# Patient Record
Sex: Female | Born: 1940 | Race: White | Hispanic: Yes | Marital: Married | Smoking: Never smoker
Health system: Southern US, Community
[De-identification: ages and names within clinical notes are randomized; demographics above are authoritative.]

## PROBLEM LIST (undated history)

## (undated) DIAGNOSIS — E7800 Pure hypercholesterolemia, unspecified: Secondary | ICD-10-CM

## (undated) DIAGNOSIS — H01003 Unspecified blepharitis right eye, unspecified eyelid: Secondary | ICD-10-CM

## (undated) DIAGNOSIS — E119 Type 2 diabetes mellitus without complications: Secondary | ICD-10-CM

## (undated) DIAGNOSIS — H01006 Unspecified blepharitis left eye, unspecified eyelid: Secondary | ICD-10-CM

## (undated) DIAGNOSIS — K2100 Gastro-esophageal reflux disease with esophagitis, without bleeding: Secondary | ICD-10-CM

## (undated) DIAGNOSIS — F0280 Dementia in other diseases classified elsewhere without behavioral disturbance: Secondary | ICD-10-CM

## (undated) DIAGNOSIS — G709 Myoneural disorder, unspecified: Secondary | ICD-10-CM

## (undated) DIAGNOSIS — F419 Anxiety disorder, unspecified: Secondary | ICD-10-CM

## (undated) DIAGNOSIS — K760 Fatty (change of) liver, not elsewhere classified: Secondary | ICD-10-CM

## (undated) DIAGNOSIS — K5790 Diverticulosis of intestine, part unspecified, without perforation or abscess without bleeding: Secondary | ICD-10-CM

## (undated) DIAGNOSIS — R5383 Other fatigue: Secondary | ICD-10-CM

## (undated) DIAGNOSIS — E039 Hypothyroidism, unspecified: Secondary | ICD-10-CM

## (undated) DIAGNOSIS — I10 Essential (primary) hypertension: Secondary | ICD-10-CM

## (undated) DIAGNOSIS — M1990 Unspecified osteoarthritis, unspecified site: Secondary | ICD-10-CM

## (undated) HISTORY — DX: Pure hypercholesterolemia, unspecified: E78.00

## (undated) HISTORY — PX: CHOLECYSTECTOMY: SHX55

## (undated) HISTORY — DX: Hypothyroidism, unspecified: E03.9

## (undated) HISTORY — DX: Anxiety disorder, unspecified: F41.9

## (undated) HISTORY — DX: Gastro-esophageal reflux disease with esophagitis, without bleeding: K21.00

## (undated) HISTORY — DX: Unspecified osteoarthritis, unspecified site: M19.90

## (undated) HISTORY — PX: COLONOSCOPY: SHX174

## (undated) HISTORY — DX: Myoneural disorder, unspecified: G70.9

## (undated) HISTORY — DX: Essential (primary) hypertension: I10

## (undated) HISTORY — DX: Gastro-esophageal reflux disease with esophagitis: K21.0

## (undated) HISTORY — DX: Other fatigue: R53.83

## (undated) HISTORY — DX: Unspecified blepharitis right eye, unspecified eyelid: H01.003

## (undated) HISTORY — DX: Diverticulosis of intestine, part unspecified, without perforation or abscess without bleeding: K57.90

## (undated) HISTORY — DX: Dementia in other diseases classified elsewhere, unspecified severity, without behavioral disturbance, psychotic disturbance, mood disturbance, and anxiety: F02.80

## (undated) HISTORY — DX: Fatty (change of) liver, not elsewhere classified: K76.0

## (undated) HISTORY — DX: Unspecified blepharitis right eye, unspecified eyelid: H01.006

## (undated) HISTORY — PX: SHOULDER SURGERY: SHX246

---

## 2003-10-27 ENCOUNTER — Ambulatory Visit: Payer: Self-pay | Admitting: *Deleted

## 2003-10-27 ENCOUNTER — Ambulatory Visit: Payer: Self-pay | Admitting: Family Medicine

## 2003-11-20 ENCOUNTER — Ambulatory Visit: Payer: Self-pay | Admitting: Internal Medicine

## 2003-12-14 ENCOUNTER — Ambulatory Visit: Payer: Self-pay | Admitting: Internal Medicine

## 2004-04-28 ENCOUNTER — Ambulatory Visit: Payer: Self-pay | Admitting: Internal Medicine

## 2004-05-17 ENCOUNTER — Ambulatory Visit: Payer: Self-pay | Admitting: Internal Medicine

## 2004-09-28 ENCOUNTER — Ambulatory Visit: Payer: Self-pay | Admitting: Internal Medicine

## 2004-12-21 ENCOUNTER — Ambulatory Visit: Payer: Self-pay | Admitting: Internal Medicine

## 2004-12-22 ENCOUNTER — Ambulatory Visit: Payer: Self-pay | Admitting: Internal Medicine

## 2004-12-26 ENCOUNTER — Encounter: Admission: RE | Admit: 2004-12-26 | Discharge: 2005-01-05 | Payer: Self-pay | Admitting: Internal Medicine

## 2004-12-30 ENCOUNTER — Ambulatory Visit: Payer: Self-pay | Admitting: Internal Medicine

## 2005-01-06 ENCOUNTER — Ambulatory Visit: Payer: Self-pay | Admitting: Internal Medicine

## 2005-02-15 ENCOUNTER — Ambulatory Visit: Payer: Self-pay | Admitting: Internal Medicine

## 2005-02-23 ENCOUNTER — Encounter (INDEPENDENT_AMBULATORY_CARE_PROVIDER_SITE_OTHER): Payer: Self-pay | Admitting: Internal Medicine

## 2005-02-23 LAB — CONVERTED CEMR LAB: Hgb A1c MFr Bld: 6 %

## 2005-03-07 ENCOUNTER — Ambulatory Visit: Payer: Self-pay | Admitting: Internal Medicine

## 2005-11-29 ENCOUNTER — Ambulatory Visit: Payer: Self-pay | Admitting: Family Medicine

## 2005-12-21 ENCOUNTER — Encounter (INDEPENDENT_AMBULATORY_CARE_PROVIDER_SITE_OTHER): Payer: Self-pay | Admitting: Internal Medicine

## 2005-12-21 ENCOUNTER — Ambulatory Visit: Payer: Self-pay | Admitting: Family Medicine

## 2005-12-28 ENCOUNTER — Ambulatory Visit: Payer: Self-pay | Admitting: Family Medicine

## 2006-01-04 ENCOUNTER — Encounter: Payer: Self-pay | Admitting: Family Medicine

## 2006-01-04 ENCOUNTER — Encounter (INDEPENDENT_AMBULATORY_CARE_PROVIDER_SITE_OTHER): Payer: Self-pay | Admitting: Internal Medicine

## 2006-01-04 ENCOUNTER — Ambulatory Visit: Payer: Self-pay | Admitting: Family Medicine

## 2006-01-04 LAB — CONVERTED CEMR LAB: Pap Smear: NORMAL

## 2006-01-05 ENCOUNTER — Ambulatory Visit (HOSPITAL_COMMUNITY): Admission: RE | Admit: 2006-01-05 | Discharge: 2006-01-05 | Payer: Self-pay | Admitting: Family Medicine

## 2006-01-19 ENCOUNTER — Ambulatory Visit: Payer: Self-pay | Admitting: Internal Medicine

## 2006-02-23 ENCOUNTER — Ambulatory Visit: Payer: Self-pay | Admitting: Internal Medicine

## 2006-03-13 ENCOUNTER — Ambulatory Visit: Payer: Self-pay | Admitting: Internal Medicine

## 2006-04-06 ENCOUNTER — Ambulatory Visit: Payer: Self-pay | Admitting: Internal Medicine

## 2006-04-06 LAB — CONVERTED CEMR LAB
ALT: 54 units/L
LDL Cholesterol: 146 mg/dL
Triglycerides: 186 mg/dL

## 2006-04-07 ENCOUNTER — Encounter (INDEPENDENT_AMBULATORY_CARE_PROVIDER_SITE_OTHER): Payer: Self-pay | Admitting: Internal Medicine

## 2006-04-23 ENCOUNTER — Ambulatory Visit: Payer: Self-pay | Admitting: Internal Medicine

## 2006-04-26 ENCOUNTER — Ambulatory Visit: Payer: Self-pay | Admitting: Internal Medicine

## 2006-04-26 LAB — CONVERTED CEMR LAB: Blood Glucose, Fasting: 79 mg/dL

## 2006-09-01 ENCOUNTER — Encounter: Payer: Self-pay | Admitting: Internal Medicine

## 2006-09-01 ENCOUNTER — Encounter (INDEPENDENT_AMBULATORY_CARE_PROVIDER_SITE_OTHER): Payer: Self-pay | Admitting: Internal Medicine

## 2006-09-01 DIAGNOSIS — R519 Headache, unspecified: Secondary | ICD-10-CM | POA: Insufficient documentation

## 2006-09-01 DIAGNOSIS — R7309 Other abnormal glucose: Secondary | ICD-10-CM | POA: Insufficient documentation

## 2006-09-01 DIAGNOSIS — M67919 Unspecified disorder of synovium and tendon, unspecified shoulder: Secondary | ICD-10-CM | POA: Insufficient documentation

## 2006-09-01 DIAGNOSIS — R51 Headache: Secondary | ICD-10-CM

## 2006-09-01 DIAGNOSIS — E039 Hypothyroidism, unspecified: Secondary | ICD-10-CM | POA: Insufficient documentation

## 2006-09-01 DIAGNOSIS — R945 Abnormal results of liver function studies: Secondary | ICD-10-CM

## 2006-09-01 DIAGNOSIS — N952 Postmenopausal atrophic vaginitis: Secondary | ICD-10-CM | POA: Insufficient documentation

## 2006-09-01 DIAGNOSIS — F329 Major depressive disorder, single episode, unspecified: Secondary | ICD-10-CM

## 2006-09-01 DIAGNOSIS — E782 Mixed hyperlipidemia: Secondary | ICD-10-CM | POA: Insufficient documentation

## 2006-09-01 DIAGNOSIS — M719 Bursopathy, unspecified: Secondary | ICD-10-CM

## 2006-09-01 DIAGNOSIS — M799 Soft tissue disorder, unspecified: Secondary | ICD-10-CM | POA: Insufficient documentation

## 2006-09-01 DIAGNOSIS — K219 Gastro-esophageal reflux disease without esophagitis: Secondary | ICD-10-CM | POA: Insufficient documentation

## 2006-09-01 DIAGNOSIS — E669 Obesity, unspecified: Secondary | ICD-10-CM | POA: Insufficient documentation

## 2006-09-01 DIAGNOSIS — F3289 Other specified depressive episodes: Secondary | ICD-10-CM | POA: Insufficient documentation

## 2006-09-01 LAB — CONVERTED CEMR LAB: Hepatitis B Surface Ag: NEGATIVE

## 2006-09-04 ENCOUNTER — Encounter (INDEPENDENT_AMBULATORY_CARE_PROVIDER_SITE_OTHER): Payer: Self-pay | Admitting: Internal Medicine

## 2006-09-10 ENCOUNTER — Ambulatory Visit: Payer: Self-pay | Admitting: Internal Medicine

## 2006-10-30 ENCOUNTER — Ambulatory Visit: Payer: Self-pay | Admitting: Internal Medicine

## 2006-11-06 ENCOUNTER — Ambulatory Visit: Payer: Self-pay | Admitting: Internal Medicine

## 2006-11-07 ENCOUNTER — Encounter (INDEPENDENT_AMBULATORY_CARE_PROVIDER_SITE_OTHER): Payer: Self-pay | Admitting: *Deleted

## 2006-11-21 ENCOUNTER — Encounter: Admission: RE | Admit: 2006-11-21 | Discharge: 2006-11-21 | Payer: Self-pay | Admitting: Family Medicine

## 2006-12-11 ENCOUNTER — Ambulatory Visit: Payer: Self-pay | Admitting: Internal Medicine

## 2006-12-28 ENCOUNTER — Ambulatory Visit: Payer: Self-pay | Admitting: Internal Medicine

## 2007-01-30 ENCOUNTER — Ambulatory Visit: Payer: Self-pay | Admitting: Internal Medicine

## 2007-02-18 ENCOUNTER — Ambulatory Visit: Payer: Self-pay | Admitting: Internal Medicine

## 2007-02-18 LAB — CONVERTED CEMR LAB
BUN: 17 mg/dL (ref 6–23)
Potassium: 4.6 meq/L (ref 3.5–5.3)
Sodium: 140 meq/L (ref 135–145)
TSH: 5.685 microintl units/mL — ABNORMAL HIGH (ref 0.350–5.50)
Total Protein: 7.9 g/dL (ref 6.0–8.3)

## 2007-03-06 ENCOUNTER — Ambulatory Visit: Payer: Self-pay | Admitting: Internal Medicine

## 2007-03-06 LAB — CONVERTED CEMR LAB
ALT: 23 units/L (ref 0–35)
Alkaline Phosphatase: 54 units/L (ref 39–117)
CO2: 25 meq/L (ref 19–32)
Calcium: 9.6 mg/dL (ref 8.4–10.5)
Chloride: 103 meq/L (ref 96–112)
Glucose, Bld: 111 mg/dL — ABNORMAL HIGH (ref 70–99)
Potassium: 4.2 meq/L (ref 3.5–5.3)
Sodium: 139 meq/L (ref 135–145)
Total Protein: 7.4 g/dL (ref 6.0–8.3)

## 2007-03-26 ENCOUNTER — Ambulatory Visit (HOSPITAL_COMMUNITY): Admission: RE | Admit: 2007-03-26 | Discharge: 2007-03-26 | Payer: Self-pay | Admitting: Family Medicine

## 2007-04-02 ENCOUNTER — Ambulatory Visit: Payer: Self-pay | Admitting: Internal Medicine

## 2007-04-19 ENCOUNTER — Ambulatory Visit: Payer: Self-pay | Admitting: Internal Medicine

## 2007-04-22 ENCOUNTER — Ambulatory Visit (HOSPITAL_BASED_OUTPATIENT_CLINIC_OR_DEPARTMENT_OTHER): Admission: RE | Admit: 2007-04-22 | Discharge: 2007-04-22 | Payer: Self-pay | Admitting: Orthopedic Surgery

## 2007-04-29 ENCOUNTER — Ambulatory Visit: Payer: Self-pay | Admitting: Internal Medicine

## 2007-06-28 ENCOUNTER — Ambulatory Visit: Payer: Self-pay | Admitting: Internal Medicine

## 2007-07-17 ENCOUNTER — Encounter: Payer: Self-pay | Admitting: Family Medicine

## 2007-07-17 ENCOUNTER — Ambulatory Visit: Payer: Self-pay | Admitting: Internal Medicine

## 2007-07-17 LAB — CONVERTED CEMR LAB
Alkaline Phosphatase: 60 units/L (ref 39–117)
Basophils Relative: 1 % (ref 0–1)
Bilirubin, Direct: 0.1 mg/dL (ref 0.0–0.3)
Cholesterol: 213 mg/dL — ABNORMAL HIGH (ref 0–200)
Creatinine, Ser: 0.74 mg/dL (ref 0.40–1.20)
Eosinophils Absolute: 0.1 10*3/uL (ref 0.0–0.7)
LDL Cholesterol: 126 mg/dL — ABNORMAL HIGH (ref 0–99)
Lymphocytes Relative: 30 % (ref 12–46)
Lymphs Abs: 2.2 10*3/uL (ref 0.7–4.0)
MCHC: 30.9 g/dL (ref 30.0–36.0)
Monocytes Absolute: 0.7 10*3/uL (ref 0.1–1.0)
Neutrophils Relative %: 60 % (ref 43–77)
Platelets: 284 10*3/uL (ref 150–400)
TSH: 2.946 microintl units/mL (ref 0.350–5.50)
Total Bilirubin: 0.6 mg/dL (ref 0.3–1.2)
Triglycerides: 223 mg/dL — ABNORMAL HIGH (ref <150)

## 2007-07-24 ENCOUNTER — Ambulatory Visit: Payer: Self-pay | Admitting: Internal Medicine

## 2007-07-26 ENCOUNTER — Ambulatory Visit (HOSPITAL_COMMUNITY): Admission: RE | Admit: 2007-07-26 | Discharge: 2007-07-26 | Payer: Self-pay | Admitting: Family Medicine

## 2008-01-01 ENCOUNTER — Ambulatory Visit: Payer: Self-pay | Admitting: Family Medicine

## 2008-01-01 DIAGNOSIS — N814 Uterovaginal prolapse, unspecified: Secondary | ICD-10-CM | POA: Insufficient documentation

## 2008-01-03 ENCOUNTER — Ambulatory Visit (HOSPITAL_COMMUNITY): Admission: RE | Admit: 2008-01-03 | Discharge: 2008-01-03 | Payer: Self-pay | Admitting: Family Medicine

## 2008-03-05 ENCOUNTER — Ambulatory Visit: Payer: Self-pay | Admitting: Obstetrics and Gynecology

## 2008-08-10 ENCOUNTER — Ambulatory Visit: Payer: Self-pay | Admitting: Internal Medicine

## 2008-08-10 DIAGNOSIS — B029 Zoster without complications: Secondary | ICD-10-CM | POA: Insufficient documentation

## 2008-08-20 ENCOUNTER — Ambulatory Visit: Payer: Self-pay | Admitting: Internal Medicine

## 2008-09-04 ENCOUNTER — Ambulatory Visit: Payer: Self-pay | Admitting: Family Medicine

## 2008-09-04 DIAGNOSIS — B0229 Other postherpetic nervous system involvement: Secondary | ICD-10-CM

## 2008-11-11 ENCOUNTER — Ambulatory Visit: Payer: Self-pay | Admitting: Internal Medicine

## 2008-11-11 ENCOUNTER — Encounter: Payer: Self-pay | Admitting: Family Medicine

## 2008-11-11 LAB — CONVERTED CEMR LAB
BUN: 18 mg/dL (ref 6–23)
Basophils Relative: 0 % (ref 0–1)
CRP: 0.7 mg/dL — ABNORMAL HIGH (ref <0.6)
Calcium: 9.6 mg/dL (ref 8.4–10.5)
Chloride: 103 meq/L (ref 96–112)
Eosinophils Relative: 1 % (ref 0–5)
HCT: 40.7 % (ref 36.0–46.0)
Hemoglobin: 13.1 g/dL (ref 12.0–15.0)
MCV: 95.8 fL (ref 78.0–100.0)
Monocytes Absolute: 0.8 10*3/uL (ref 0.1–1.0)
Neutro Abs: 4.9 10*3/uL (ref 1.7–7.7)
Platelets: 310 10*3/uL (ref 150–400)
Potassium: 4.7 meq/L (ref 3.5–5.3)
RDW: 13.7 % (ref 11.5–15.5)
Rhuematoid fact SerPl-aCnc: <20 intl units/mL (ref 0–20)
Sodium: 140 meq/L (ref 135–145)
Total Bilirubin: 0.3 mg/dL (ref 0.3–1.2)
Total Protein: 7.8 g/dL (ref 6.0–8.3)

## 2008-11-23 ENCOUNTER — Ambulatory Visit: Payer: Self-pay | Admitting: Internal Medicine

## 2008-11-23 DIAGNOSIS — M1990 Unspecified osteoarthritis, unspecified site: Secondary | ICD-10-CM | POA: Insufficient documentation

## 2009-02-17 ENCOUNTER — Ambulatory Visit: Payer: Self-pay | Admitting: Nurse Practitioner

## 2009-02-17 DIAGNOSIS — I10 Essential (primary) hypertension: Secondary | ICD-10-CM

## 2009-02-17 DIAGNOSIS — M2550 Pain in unspecified joint: Secondary | ICD-10-CM

## 2009-02-17 LAB — CONVERTED CEMR LAB
Cholesterol, target level: 200 mg/dL
LDL Goal: 160 mg/dL

## 2009-02-18 ENCOUNTER — Ambulatory Visit: Payer: Self-pay | Admitting: Nurse Practitioner

## 2009-03-02 LAB — CONVERTED CEMR LAB
Anti Nuclear Antibody(ANA): NEGATIVE
Cholesterol: 218 mg/dL — ABNORMAL HIGH (ref 0–200)
TSH: 4.429 microintl units/mL (ref 0.350–4.500)

## 2009-03-08 ENCOUNTER — Ambulatory Visit: Payer: Self-pay | Admitting: Nurse Practitioner

## 2009-03-08 DIAGNOSIS — G4700 Insomnia, unspecified: Secondary | ICD-10-CM | POA: Insufficient documentation

## 2009-05-19 ENCOUNTER — Ambulatory Visit: Payer: Self-pay | Admitting: Nurse Practitioner

## 2009-05-19 DIAGNOSIS — M25569 Pain in unspecified knee: Secondary | ICD-10-CM | POA: Insufficient documentation

## 2009-06-07 ENCOUNTER — Ambulatory Visit: Payer: Self-pay | Admitting: Nurse Practitioner

## 2009-06-07 LAB — CONVERTED CEMR LAB
Cholesterol: 214 mg/dL — ABNORMAL HIGH (ref 0–200)
LDL Cholesterol: 132 mg/dL — ABNORMAL HIGH (ref 0–99)
Total CHOL/HDL Ratio: 4.4
VLDL: 33 mg/dL (ref 0–40)

## 2009-06-08 ENCOUNTER — Encounter (INDEPENDENT_AMBULATORY_CARE_PROVIDER_SITE_OTHER): Payer: Self-pay | Admitting: Nurse Practitioner

## 2009-08-17 ENCOUNTER — Encounter (INDEPENDENT_AMBULATORY_CARE_PROVIDER_SITE_OTHER): Payer: Self-pay | Admitting: Nurse Practitioner

## 2009-10-05 ENCOUNTER — Ambulatory Visit: Payer: Self-pay | Admitting: Internal Medicine

## 2009-10-05 DIAGNOSIS — R05 Cough: Secondary | ICD-10-CM

## 2009-10-06 ENCOUNTER — Encounter (INDEPENDENT_AMBULATORY_CARE_PROVIDER_SITE_OTHER): Payer: Self-pay | Admitting: Nurse Practitioner

## 2009-10-06 ENCOUNTER — Ambulatory Visit (HOSPITAL_COMMUNITY): Admission: RE | Admit: 2009-10-06 | Discharge: 2009-10-06 | Payer: Self-pay | Admitting: Internal Medicine

## 2009-10-20 ENCOUNTER — Telehealth (INDEPENDENT_AMBULATORY_CARE_PROVIDER_SITE_OTHER): Payer: Self-pay | Admitting: Nurse Practitioner

## 2009-11-04 ENCOUNTER — Ambulatory Visit: Payer: Self-pay | Admitting: Nurse Practitioner

## 2009-11-04 LAB — CONVERTED CEMR LAB
Basophils Absolute: 0 10*3/uL (ref 0.0–0.1)
Eosinophils Relative: 1 % (ref 0–5)
Lymphocytes Relative: 30 % (ref 12–46)
Lymphs Abs: 2.2 10*3/uL (ref 0.7–4.0)
MCV: 94.8 fL (ref 78.0–100.0)
Monocytes Absolute: 0.8 10*3/uL (ref 0.1–1.0)
RBC: 4.46 M/uL (ref 3.87–5.11)
RDW: 13.5 % (ref 11.5–15.5)

## 2009-11-05 ENCOUNTER — Encounter (INDEPENDENT_AMBULATORY_CARE_PROVIDER_SITE_OTHER): Payer: Self-pay | Admitting: Nurse Practitioner

## 2009-12-15 ENCOUNTER — Ambulatory Visit: Payer: Self-pay | Admitting: Nurse Practitioner

## 2010-02-20 HISTORY — PX: TOTAL ABDOMINAL HYSTERECTOMY: SHX209

## 2010-03-23 NOTE — Assessment & Plan Note (Signed)
Summary: F/u CXRAY/Cough   Vital Signs:  Patient profile:   70 year old female Menstrual status:  postmenopausal Weight:      167.4 pounds BMI:     29.53 O2 Sat:      98 % on Room air Temp:     97.6 degrees F oral Pulse rate:   72 / minute Pulse rhythm:   regular Resp:     16 per minute BP sitting:   138 / 90  (left arm) Cuff size:   regular  Vitals Entered By: Levon Hedger (November 04, 2009 9:45 AM)  Nutrition Counseling: Patient's BMI is greater than 25 and therefore counseled on weight management options.  O2 Flow:  Room air CC: follow-up bisit...pt is not taking any of her medication                                                           , Hypertension Management, Abdominal Pain Is Patient Diabetic? No CBG Result 117 CBG Device ID B  Does patient need assistance? Functional Status Self care Ambulation Normal Comments pt not taking celebrex anymore.   CC:  follow-up bisit...pt is not taking any of her medication                                                           , Hypertension Management, and Abdominal Pain.  History of Present Illness:  Pt into the office for f/u for visit on cough. Pt was prescribed lisinopril in [place] and she now reports that she only took for 1 week. However, the pharamacy kept giving her the medications despite the fact that she was not taking it. Pt stopped taking the medications because her BP checks at the pharmacy were good.  CXRAY done on 10/06/2009 - Wedge shaped peripherally opacity in the lateral left lung base.  suspicious for an area of infection. Pt was not treated with antibiotics.   Denies any worsening of symptoms and actually improved. She was ordered xyzal and tessalon perles which she has not taken  because cough improves.  Community liason into the room to interpret for pt  Dyspepsia History:      She has no alarm features of dyspepsia including no history of melena, hematochezia, dysphagia, persistent  vomiting, or involuntary weight loss > 5%.  There is a prior history of GERD.  The patient does not have a prior history of documented ulcer disease.  The dominant symptom is heartburn or acid reflux.  An H-2 blocker medication is currently being taken.  She notes that the symptoms have not improved with the H-2 blocker therapy.  Symptoms have not persisted after 4 weeks of H-2 blocker treatment.  No previous upper endoscopy has been done.    Hypertension History:      She denies headache, chest pain, and palpitations.  Pt was advised to hold her lisinopril following last visit due to cough.  .  Further comments include: Pt has also stopped celebrex and reports that she believes that was the cause for cough.        Positive major cardiovascular risk factors include  female age 52 years old or older, hyperlipidemia, and hypertension.  Negative major cardiovascular risk factors include no history of diabetes and non-tobacco-user status.        Further assessment for target organ damage reveals no history of ASHD, cardiac end-organ damage (CHF/LVH), stroke/TIA, peripheral vascular disease, renal insufficiency, or hypertensive retinopathy.     Habits & Providers  Alcohol-Tobacco-Diet     Alcohol drinks/day: 0     Tobacco Status: never  Exercise-Depression-Behavior     Does Patient Exercise: no     Exercise Counseling: to improve exercise regimen     Type of exercise: walk     Depression Counseling: not indicated; screening negative for depression  Allergies (verified): No Known Drug Allergies  Review of Systems General:  Denies fever. CV:  Denies chest pain or discomfort. Resp:  Denies cough; cough has improved. GI:  Complains of indigestion. MS:  celebrex worked well for her joints but she stopped because she thought it was causing a cough.  Physical Exam  General:  alert.   Head:  normocephalic.   Lungs:  normal breath sounds.   Heart:  normal rate and regular rhythm.   Abdomen:   normal bowel sounds.   Msk:  up to the exam table Neurologic:  alert & oriented X3.   Skin:  color normal.   Psych:  Oriented X3.     Impression & Recommendations:  Problem # 1:  COUGH (ICD-786.2)  improved likely that celebrex over time was given pt reflux symptoms which was causing her to cough  Orders: T-CBC w/Diff (14782-95621) Pulse Oximetry (single measurment) (30865)  Problem # 2:  GASTROESOPHAGEAL REFLUX DISEASE (ICD-530.81) will give ranitidine for 1 month as she can get from [facility] Will then try to get aciphex from [place] pharmacy Her updated medication list for this problem includes:    Aciphex 20 Mg Tbec (Rabeprazole sodium) ..... One tablet by mouth before breakfast for stomach    Ranitidine Hcl 300 Mg Tabs (Ranitidine hcl) ..... One tablet by mouth nightly for stomach before bedtime  Orders: Hemoglobin A1C (83036)  Problem # 3:  OSTEOARTHRITIS (ICD-715.90) celebrex works well but is likely causing GI problems  Her updated medication list for this problem includes:    Celebrex 200 Mg Caps (Celecoxib) ..... One capsule by mouth daily for joints    Tramadol Hcl 50 Mg Tabs (Tramadol hcl) ..... One tablet by mouth two times a day as needed for pain  Complete Medication List: 1)  Celebrex 200 Mg Caps (Celecoxib) .... One capsule by mouth daily for joints 2)  Tramadol Hcl 50 Mg Tabs (Tramadol hcl) .... One tablet by mouth two times a day as needed for pain 3)  Aciphex 20 Mg Tbec (Rabeprazole sodium) .... One tablet by mouth before breakfast for stomach 4)  Nasacort Aq 55 Mcg/act Aers (Triamcinolone acetonide(nasal)) .... 2 sprays each nostril daily 5)  Ranitidine Hcl 300 Mg Tabs (Ranitidine hcl) .... One tablet by mouth nightly for stomach before bedtime  Other Orders: Capillary Blood Glucose/CBG (78469)  Dyspepsia Assessment/Plan:  Step Therapy: GERD Treatment Protocols:    Step-1: started    H-2 blocker chosen: Ranitidine 150mg  by mouth at  bedtime  Hypertension Assessment/Plan:      The patient's hypertensive risk group is category B: At least one risk factor (excluding diabetes) with no target organ damage.  Her calculated 10 year risk of coronary heart disease is 15 %.  Today's blood pressure is 138/90.  Her blood pressure goal is <  140/90.   Patient Instructions: 1)  Start ranitidine 300mg  by mouth nightly. 2)  Take this for one week then start back taking the celebrex 200mg  by mouth daily. Take the celebrex AFTER food 3)  Follow up in 4 weeks with n.martin,fnp for acid reflux 4)  May get flu vaccine if available at that time Prescriptions: RANITIDINE HCL 300 MG TABS (RANITIDINE HCL) One tablet by mouth nightly for stomach before bedtime  #30 x 0   Entered and Authorized by:   [name] FNP   Signed by:   [name] FNP on 11/04/2009   Method used:   Print then Give to Patient   RxID:   [id number] ACIPHEX 20 MG TBEC (RABEPRAZOLE SODIUM) One tablet by mouth before breakfast for stomach  #30 x 5   Entered and Authorized by:   [name] FNP   Signed by:   [name] FNP on 11/04/2009   Method used:   Faxed to ...       [facility] (retail)       [address] [place], [place]  [address]       Ph: [phone]       Fax: [phone]   RxID:   [id number]

## 2010-03-23 NOTE — Letter (Signed)
Summary: Handout Printed  Printed Handout:  - Insomnia 

## 2010-03-23 NOTE — Miscellaneous (Signed)
Summary: FLU SHOT  FLU SHOT   Imported By: Arta Bruce 12/16/2009 15:26:52  _____________________________________________________________________  External Attachment:    Type:   Image     Comment:   External Document

## 2010-03-23 NOTE — Letter (Signed)
Summary: *HSN Results Follow up  [facility]  [address] [place], [place] [address]   Phone: [phone]  Fax: [phone]      11/05/2009   Heidi Preston [address] [place], [place]  [address]   Dear  [name],                            ____S.Drinkard,FNP   ____D. Gore,FNP       ____B. McPherson,MD   ____V. Rankins,MD    ____E. Mulberry,MD    __X__N. Daphine Deutscher, FNP  ____D. Reche Dixon, MD    ____K. Philipp Deputy, MD    ____Other     This letter is to inform you that your recent test(s):  _______Pap Smear    ___X____Lab Test     _______X-ray    ___X____ is within acceptable limits  _______ requires a medication change  _______ requires a follow-up lab visit  _______ requires a follow-up visit with your provider   Comments:  Labs done during recent office visit are normal.       _________________________________________________________ If you have any questions, please contact our office [phone].                    Sincerely,    [name] FNP [facility]

## 2010-03-23 NOTE — Letter (Signed)
Summary: Handout Printed  Printed Handout:  - Diet - Low-Cholesterol Guidelines 

## 2010-03-23 NOTE — Letter (Signed)
Summary: Lipid Letter  [facility]  [address] [place], [place] [address]   Phone: [phone]  Fax: [phone]    06/08/2009  [facility] [address] [place], [place]  [address]  Dear Ms. Gosnell-zapata:  We have carefully reviewed your last lipid profile from 06/07/2009 and the results are noted below with a summary of recommendations for lipid management.    Cholesterol:       214     Goal: less than 200   HDL "good" Cholesterol:   49     Goal: greater than 40   LDL "bad" Cholesterol:   132     Goal: less than 130   Triglycerides:       164     Goal: les than 150    Cholesterol is still slightly elevated.  No medications at this time but AVOID fried fatty foods. Eat low fat foods.  Remember to increase your good cholesterol by walking and physical activity.  Thyroid labs ok.      Current Medications: 1)    Celebrex 200 Mg Caps (Celecoxib) .... One capsule by mouth daily for joints 2)    Tramadol Hcl 50 Mg Tabs (Tramadol hcl) .... One tablet by mouth two times a day as needed for pain 3)    Aciphex 20 Mg Tbec (Rabeprazole sodium) .... One tablet by mouth daily before breakfast 4)    Lisinopril 10 Mg Tabs (Lisinopril) .... One tablet by mouth daily for blood pressure  If you have any questions, please call. We appreciate being able to work with you.   Sincerely,    [facility] [name] FNP

## 2010-03-23 NOTE — Letter (Signed)
Summary: CONNECTION TO CARE  CONNECTION TO CARE   Imported By: Arta Bruce 08/18/2009 12:41:48  _____________________________________________________________________  External Attachment:    Type:   Image     Comment:   External Document

## 2010-03-23 NOTE — Assessment & Plan Note (Signed)
Summary: Heidi Deutscher FNP/ BAD COUGH/ STOMACH PAIN//G   Vital Signs:  Patient profile:   70 year old female Menstrual status:  postmenopausal Weight:      169 pounds Temp:     97.7 degrees F Pulse rate:   64 / minute Pulse rhythm:   regular Resp:     18 per minute BP sitting:   120 / 80  (left arm) Cuff size:   regular  Vitals Entered By: Vesta Mixer CMA (October 05, 2009 10:27 AM) CC: throat feeling dry for about one month, almost feeling like she has to throw up Is Patient Diabetic? No  Does patient need assistance? Ambulation Normal   CC:  throat feeling dry for about one month and almost feeling like she has to throw up.  History of Present Illness: 1.  Severe cough:  Difficulty pinning pt. down on this, but sounds like it happens at any time.  Generally a dry cough.  Has had cough for abou 2 months.  Was on Lisinopril, but states has not taken for 1 month.  Pt. states cough started off mild and gradually worsened.   Throat is dry.  Coughs in parosxyms to point of eyes watering, cannot breathe and like she will vomit.  No congestion, posterior pharyngeal drainage. Coughing often preceded by tickle in throat.  Feels fine when not coughing--no dyspnea then.    Pt. is a nonsmoker and is not around any smokers.  2.  Hypertension:  pt. stopped Lisinopril as she was checking her blood pressure in pharmacies and it was okay, so stopped.   Husband brings in bottle filled 09/24/09--all 30 pills there.  Pt. really firm that she stopped the medicine at least 1 month ago.  Allergies (verified): No Known Drug Allergies  Physical Exam  General:  Overweight female, NAD Eyes:  No corneal or conjunctival inflammation noted. EOMI. Perrla. Funduscopic exam benign, without hemorrhages, exudates or papilledema. Vision grossly normal. Ears:  External ear exam shows no significant lesions or deformities.  Otoscopic examination reveals clear canals, tympanic membranes are intact bilaterally without  bulging, retraction, inflammation or discharge. Hearing is grossly normal bilaterally. Nose:  Possibly some mild mucosal swelling.  No discharge Mouth:  pharynx pink and moist.  No obvious cobbling. Neck:  No deformities, masses, or tenderness noted. Lungs:  Normal respiratory effort, chest expands symmetrically. Lungs are clear to auscultation, no crackles or wheezes. Heart:  Normal rate and regular rhythm. S1 and S2 normal without gallop, murmur, click, rub or other extra sounds.  Radial pulses normal and equal.   Impression & Recommendations:  Problem # 1:  COUGH (ICD-786.2)  Suspect this started out secondary to ACE I side effect. Treat with cough suppressants and allergy meds with the sense of tickling in throat. Hold on ACE I and consider ARB when returns for follow up if BP back up.  Orders: Diagnostic X-Ray/Fluoroscopy (Diagnostic X-Ray/Flu)  Problem # 2:  HYPERTENSION, BENIGN ESSENTIAL (ICD-401.1) Discussed with pt. that she does have the diagnosis of hypertension and that her blood pressures were probably better as she was on the medication. Discussed her bp would likely elevate without the Lisinopril. The following medications were removed from the medication list:    Lisinopril 10 Mg Tabs (Lisinopril) ..... One tablet by mouth daily for blood pressure  Complete Medication List: 1)  Celebrex 200 Mg Caps (Celecoxib) .... One capsule by mouth daily for joints 2)  Tramadol Hcl 50 Mg Tabs (Tramadol hcl) .... One tablet by mouth two  times a day as needed for pain 3)  Aciphex 20 Mg Tbec (Rabeprazole sodium) .... One tablet by mouth daily before breakfast 4)  Nasacort Aq 55 Mcg/act Aers (Triamcinolone acetonide(nasal)) .... 2 sprays each nostril daily 5)  Xyzal 5 Mg Tabs (Levocetirizine dihydrochloride) .Marland Kitchen.. 1 tab by mouth daily 6)  Tessalon Perles 100 Mg Caps (Benzonatate) .Marland Kitchen.. 1 cap by mouth every 8 hours as needed for cough--do not cough or bite  Patient Instructions: 1)   Follow up with Jesse Fall FNP in 1 month for cough and hypertension. 2)  Stop Lisinopril to avoid worsening. Prescriptions: TESSALON PERLES 100 MG CAPS (BENZONATATE) 1 cap by mouth every 8 hours as needed for cough--do not cough or bite  #20 x 0   Entered and Authorized by:   Julieanne Manson MD   Signed by:   Julieanne Manson MD on 10/05/2009   Method used:   Faxed to ...       [facility] (retail)       [address] [place], [place]  [address]       Ph: [phone]       Fax: [phone]   RxID:   [id number] XYZAL 5 MG TABS (LEVOCETIRIZINE DIHYDROCHLORIDE) 1 tab by mouth daily  #30 x 4   Entered and Authorized by:   Julieanne Manson MD   Signed by:   Julieanne Manson MD on 10/05/2009   Method used:   Faxed to ...       [facility] - [facility] (retail)       [address] [place], [place]  [address]       Ph: [phone]       Fax: [phone]   RxID:   [id number] NASACORT AQ 55 MCG/ACT AERS (TRIAMCINOLONE ACETONIDE(NASAL)) 2 sprays each nostril daily  #1 x 4   Entered and Authorized by:   Julieanne Manson MD   Signed by:   Julieanne Manson MD on 10/05/2009   Method used:   Faxed to ...       [facility] - [facility] (retail)       [address] [place], [place]  [address]       Ph: [phone]       Fax: [phone]   RxID:   [id number]   Appended Document: Heidi Deutscher FNP/ BAD COUGH/ STOMACH PAIN//G Medications Added LISINOPRIL 20 MG TABS (LISINOPRIL) stop lisinopril          Clinical Lists Changes  Medications: Added new medication of LISINOPRIL 20 MG TABS (LISINOPRIL) stop lisinopril - Signed Removed medication of LISINOPRIL 20 MG TABS (LISINOPRIL) stop lisinopril Rx of LISINOPRIL 20 MG TABS (LISINOPRIL) stop lisinopril;  #0 x 0;  Signed;  Entered by: Julieanne Manson MD;  Authorized by: Julieanne Manson MD;  Method  used: Faxed to [facility], [address], [place], [place]  [address], Ph: [phone], Fax: [phone]    Prescriptions: LISINOPRIL 20 MG TABS (LISINOPRIL) stop lisinopril  #0 x 0   Entered and Authorized by:   Julieanne Manson MD   Signed by:   Julieanne Manson MD on 10/05/2009   Method used:   Faxed to ...       [facility] - [facility] (retail)       [address] [place],  [place]  [address]       Ph: [phone]       Fax: [phone]   RxID:   [id number]  Used to send fax to [facility]-- to stop Lisinopril--not represcribing

## 2010-03-23 NOTE — Progress Notes (Signed)
Summary: Darlin Coco results   Phone Note Outgoing Call   Summary of Call: Spanish speaking pt call pt and see how she is doing.  She needs to come in for follow up. CXRAY done on 10/06/2009 shows that she may have had an infection (faxed results in my office - unsure why it was not sent electronically) Initial call taken by: [name] FNP,  October 20, 2009 5:16 PM  Follow-up for Phone Call        Pt is feeling Good since she stop taking Lisinopril when she came to see Dr Delrae Alfred F/u appt 11-04-09 @ 9:45am  Follow-up by: Cheryll Dessert,  October 21, 2009 8:36 AM      CXR  Procedure date:  10/06/2009  Findings:      Wedge shaped peripherally opacity in the lateral left lung base.  suspicious for an area of infection. recommended radiographic f/u until clearing.  Phone Note Outgoing Call   Summary of Call: Spanish speaking pt call pt and see how she is doing.  She needs to come in for follow up. CXRAY done on 10/06/2009 shows that she may have had an infection (faxed results in my office - unsure why it was not sent electronically) Initial call taken by: [name] FNP,  October 20, 2009 5:16 PM  Follow-up for Phone Call        Pt is feeling Good since she stop taking Lisinopril when she came to see Dr Delrae Alfred F/u appt 11-04-09 @ 9:45am  Follow-up by: Cheryll Dessert,  October 21, 2009 8:36 AM      CXR  Procedure date:  10/06/2009  Findings:      Wedge shaped peripherally opacity in the lateral left lung base.  suspicious for an area of infection. recommended radiographic f/u until clearing.

## 2010-03-23 NOTE — Assessment & Plan Note (Signed)
Summary: HTN/Review lab results   Vital Signs:  Patient profile:   70 year old female Menstrual status:  postmenopausal Height:      63.25 inches Weight:      165.5 pounds BMI:     29.19 Temp:     97.8 degrees F oral Pulse rate:   67 / minute Pulse rhythm:   regular Resp:     18 per minute BP sitting:   145 / 83  (left arm) Cuff size:   regular  Vitals Entered By: Arthor Captain (March 08, 2009 11:04 AM) CC: follow-up visit, jt. pain, Hypertension Management, Lipid Management Pain Assessment Patient in pain? yes     Location: generalized joint pain Intensity: 2 Type: aching Onset of pain  Chronic  Does patient need assistance? Functional Status Self care Ambulation Normal     Menstrual Status postmenopausal Last PAP Result Normal   CC:  follow-up visit, jt. pain, Hypertension Management, and Lipid Management.  History of Present Illness:  Pt into the office for follow on joint pain Pt has started the celebrex as ordered. She is taking the tramadol as needed at night. Still with pain in her hands and shoulders. 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    Hypertension History:      She denies headache, chest pain, and palpitations.  no current medications.        Positive major cardiovascular risk factors include female age 54 years old or older, hyperlipidemia, and hypertension.  Negative major cardiovascular risk factors include no history of diabetes and non-tobacco-user status.        Further assessment for target organ damage reveals no history of ASHD, cardiac end-organ damage (CHF/LVH), stroke/TIA, peripheral vascular disease, renal insufficiency, or hypertensive retinopathy.    Lipid Management History:      Positive NCEP/ATP III risk factors include female age 68 years old or older and hypertension.  Negative NCEP/ATP III risk factors include non-diabetic, non-tobacco-user status, no ASHD (atherosclerotic heart disease), no prior stroke/TIA, no  peripheral vascular disease, and no history of aortic aneurysm.        The patient states that she does not know about the "Therapeutic Lifestyle Change" diet.  The patient does not know about adjunctive measures for cholesterol lowering.  She expresses no side effects from her lipid-lowering medication.  Comments include: no current meds at this time.  The patient denies any symptoms to suggest myopathy or liver disease.      Allergies (verified): No Known Drug Allergies  Review of Systems General:  Complains of sleep disorder; lies in bed for 2 hours before going to sleep. going on for the past 6 months. . CV:  Denies chest pain or discomfort. Resp:  Denies cough. GI:  Denies abdominal pain, nausea, and vomiting. MS:  Complains of joint pain; hands and shoulders.  Physical Exam  General:  alert.   Eyes:  glasses Lungs:  normal breath sounds.   Heart:  normal rate and regular rhythm.   holosystolic murmur Msk:  active ROM up to the exam table   Impression & Recommendations:  Problem # 1:  OSTEOARTHRITIS (ICD-715.90) reviewed labs with pt advised that she continue the celebrex Her updated medication list for this problem includes:    Celebrex 200 Mg Caps (Celecoxib) ..... One capsule by mouth daily for joints    Tramadol Hcl 50 Mg Tabs (Tramadol hcl) ..... One tablet by mouth two times a day as needed for pain  Problem #  2:  HYPERTENSION, BENIGN ESSENTIAL (ICD-401.1)  will need to start medications because BP is elevated again today DASH diet  Her updated medication list for this problem includes:    Lisinopril 10 Mg Tabs (Lisinopril) ..... One tablet by mouth daily for blood pressure  Problem # 3:  HYPERLIPIDEMIA, MIXED (ICD-272.2) reviewed labs with pt  Problem # 4:  INSOMNIA (ICD-780.52) advsied pt to read handout about sleep habits  Complete Medication List: 1)  Celebrex 200 Mg Caps (Celecoxib) .... One capsule by mouth daily for joints 2)  Tramadol Hcl 50 Mg  Tabs (Tramadol hcl) .... One tablet by mouth two times a day as needed for pain 3)  Aciphex 20 Mg Tbec (Rabeprazole sodium) .... One tablet by mouth daily before breakfast 4)  Lisinopril 10 Mg Tabs (Lisinopril) .... One tablet by mouth daily for blood pressure  Hypertension Assessment/Plan:      The patient's hypertensive risk group is category B: At least one risk factor (excluding diabetes) with no target organ damage.  Her calculated 10 year risk of coronary heart disease is 15 %.  Today's blood pressure is 145/83.  Her blood pressure goal is < 140/90.  Lipid Assessment/Plan:      Based on NCEP/ATP III, the patient's risk factor category is "2 or more risk factors and a calculated 10 year CAD risk of < 20%".  The patient's lipid goals are as follows: Total cholesterol goal is 200; LDL cholesterol goal is 160; HDL cholesterol goal is 40; Triglyceride goal is 150.    Patient Instructions: 1)  Your cholesterol is elevated today. Read handout about foods to avoid.   2)  Follow up for recheck of cholesterol in 3 months. 3)  Sleep - read the handout for sleep and try to change your routine 4)  High blood pressure - you will need to start a low dose blood pressure medications - take once daily. 5)  Follow up in office in 1 month for blood pressure check and sleep. Prescriptions: LISINOPRIL 10 MG TABS (LISINOPRIL) One tablet by mouth daily for blood pressure  #30 x 5   Entered and Authorized by:   [name] FNP   Signed by:   [name] FNP on 03/08/2009   Method used:   Faxed to ...       [facility] - [facility] (retail)       [address] [place], [place]  [address]       Ph: [phone]       Fax: [phone]   RxID:   [id number]

## 2010-03-23 NOTE — Assessment & Plan Note (Signed)
Summary: Left knee pain   Vital Signs:  Patient profile:   70 year old female Menstrual status:  postmenopausal Weight:      166.50 pounds BMI:     29.37 Temp:     97.9 degrees F Pulse rate:   65 / minute Pulse rhythm:   regular Resp:     16 per minute BP sitting:   160 / 83  (left arm) Cuff size:   regular  Vitals Entered By: Chauncy Passy, SMA CC: Pt. is here complaining of knee pain. She states her knees started to get swollen about 15 days ago. She attributes the pain and swolling to the cold temp. She is also complaining of headaches which she says it may be due to the lack of sleep. , Hypertension Management, Lipid Management Is Patient Diabetic? No Pain Assessment Patient in pain? yes     Location: knee Intensity: 6 Type: dull Onset of pain  Constant  Does patient need assistance? Functional Status Self care Ambulation Normal   CC:  Pt. is here complaining of knee pain. She states her knees started to get swollen about 15 days ago. She attributes the pain and swolling to the cold temp. She is also complaining of headaches which she says it may be due to the lack of sleep. , Hypertension Management, and Lipid Management.  History of Present Illness:  Pt into the office with complaints of left knee Started 2 weeks ago with pain in her left knee Constant pain since that time Barely able to walk or bare weight on the left leg due to pain. She has been taking tramadol and naprosyn for the pain with minimal relief She has also been using anaglesic cream to the knee with some massages Swelling has gone down since the time she initailly made the appt Denies any trauma.  No shellfish No organ meat No aged cheese No beer No yeast bread or rolls  Daughter present with pt during the exam who is acting as her interpreter   Hypertension History:      She complains of headache, but denies chest pain and palpitations.  She notes no problems with any antihypertensive  medication side effects.  Pt was prescribed lisinopril in January but pt admits that she does not take daily.  When she takes the pain medications she does not take her blood pressure medication.        Positive major cardiovascular risk factors include female age 31 years old or older, hyperlipidemia, and hypertension.  Negative major cardiovascular risk factors include no history of diabetes and non-tobacco-user status.        Further assessment for target organ damage reveals no history of ASHD, cardiac end-organ damage (CHF/LVH), stroke/TIA, peripheral vascular disease, renal insufficiency, or hypertensive retinopathy.    Lipid Management History:      Positive NCEP/ATP III risk factors include female age 67 years old or older and hypertension.  Negative NCEP/ATP III risk factors include non-diabetic, non-tobacco-user status, no ASHD (atherosclerotic heart disease), no prior stroke/TIA, no peripheral vascular disease, and no history of aortic aneurysm.        The patient states that she does not know about the "Therapeutic Lifestyle Change" diet.  Comments include: no current medications.  Comments: will recheck lipids next month .    Current Medications (verified): 1)  Celebrex 200 Mg Caps (Celecoxib) .... One Capsule By Mouth Daily For Joints 2)  Tramadol Hcl 50 Mg Tabs (Tramadol Hcl) .... One Tablet By  Mouth Two Times A Day As Needed For Pain 3)  Aciphex 20 Mg Tbec (Rabeprazole Sodium) .... One Tablet By Mouth Daily Before Breakfast 4)  Lisinopril 10 Mg Tabs (Lisinopril) .... One Tablet By Mouth Daily For Blood Pressure  Allergies (verified): No Known Drug Allergies  Review of Systems CV:  Denies chest pain or discomfort. Resp:  Denies cough. MS:  Complains of joint pain; left knee.  Physical Exam  General:  alert.   Eyes:  glasses Lungs:  normal breath sounds.   Heart:  normal rate and regular rhythm.   Neurologic:  alert & oriented X3.     Knee Exam  General:    average  weight.    Knee Exam:    Left:    Inspection:  Normal       Location:  lateral capsule    Stability:  stable    Tenderness:  lateral collateral    Swelling:  no    Erythema:  no   Impression & Recommendations:  Problem # 1:  KNEE PAIN, LEFT (ICD-719.46)  arthritis left knee cleaned and prepped under sterile technique sterile gloves used Depomedrol 40mg  1cc mixed with lidocane 1% 5cc injected laterally  Bandaid applied Pt informed of risks and benefits.  pt advised that joint may be painful for 24-36 hours but if last for more than 36 hours she should return to the office Her updated medication list for this problem includes:    Celebrex 200 Mg Caps (Celecoxib) ..... One capsule by mouth daily for joints    Tramadol Hcl 50 Mg Tabs (Tramadol hcl) ..... One tablet by mouth two times a day as needed for pain  Orders: Joint Aspirate / Injection, Intermediate (16109)  Problem # 2:  HYPERTENSION, BENIGN ESSENTIAL (ICD-401.1) DASH diet advised pt to take BP meds daily Her updated medication list for this problem includes:    Lisinopril 10 Mg Tabs (Lisinopril) ..... One tablet by mouth daily for blood pressure  Complete Medication List: 1)  Celebrex 200 Mg Caps (Celecoxib) .... One capsule by mouth daily for joints 2)  Tramadol Hcl 50 Mg Tabs (Tramadol hcl) .... One tablet by mouth two times a day as needed for pain 3)  Aciphex 20 Mg Tbec (Rabeprazole sodium) .... One tablet by mouth daily before breakfast 4)  Lisinopril 10 Mg Tabs (Lisinopril) .... One tablet by mouth daily for blood pressure  Hypertension Assessment/Plan:      The patient's hypertensive risk group is category B: At least one risk factor (excluding diabetes) with no target organ damage.  Her calculated 10 year risk of coronary heart disease is 17 %.  Today's blood pressure is 160/83.  Her blood pressure goal is < 140/90.  Lipid Assessment/Plan:      Based on NCEP/ATP III, the patient's risk factor category is "2  or more risk factors and a calculated 10 year CAD risk of < 20%".  The patient's lipid goals are as follows: Total cholesterol goal is 200; LDL cholesterol goal is 160; HDL cholesterol goal is 40; Triglyceride goal is 150.    Patient Instructions: 1)  Keep lab appointment in April 18th for lipids and tsh and blood pressure check. 2)  You have received an injection in your left knee.  This should help with the pain.  You can take your medications for pain as previously ordered.   3)  Blood pressure - you need to take your lisinopril 10mg  by mouth daily.  This will help lower your  blood pressure 4)  You can take Glucosamine/costochondrotin twice daily for joints  Prevention & Chronic Care Immunizations   Influenza vaccine: Fluvax Non-MCR  (01/04/2006)    Tetanus booster: 02/23/2006: Td    Pneumococcal vaccine: Pneumovax  (01/04/2006)    H. zoster vaccine: Not documented  Colorectal Screening   Hemoccult: Negative  (01/04/2006)    Colonoscopy: Not documented  Other Screening   Pap smear: Normal  (01/04/2006)    Mammogram: No specific mammographic evidence of malignancy.  Assessment: BIRADS 1.   (07/22/2007)   Mammogram action/deferral: Screening mammogram in 1 year.     (07/22/2007)    DXA bone density scan:  Lumbar Spine:  T Score-2.5 to -1.0 Spine.   Hip Total: T Score > -1.0 Hip.    (01/03/2008)   DXA scan due: 12/2009    Smoking status: never  (02/17/2009)  Lipids   Total Cholesterol: 218  (02/18/2009)   LDL: 138  (02/18/2009)   LDL Direct: Not documented   HDL: 46  (02/18/2009)   Triglycerides: 168  (02/18/2009)    SGOT (AST): 33  (11/11/2008)   SGPT (ALT): 33  (11/11/2008)   Alkaline phosphatase: 60  (11/11/2008)   Total bilirubin: 0.3  (11/11/2008)  Hypertension   Last Blood Pressure: 160 / 83  (05/19/2009)   Serum creatinine: 0.70  (11/11/2008)   Serum potassium 4.7  (11/11/2008)  Self-Management Support :    Hypertension self-management support: Not  documented    Lipid self-management support: Not documented

## 2010-06-27 ENCOUNTER — Other Ambulatory Visit (HOSPITAL_COMMUNITY): Payer: Self-pay | Admitting: Family Medicine

## 2010-06-27 DIAGNOSIS — M25511 Pain in right shoulder: Secondary | ICD-10-CM

## 2010-07-04 ENCOUNTER — Ambulatory Visit (HOSPITAL_COMMUNITY)
Admission: RE | Admit: 2010-07-04 | Discharge: 2010-07-04 | Disposition: A | Discharge status: Home / Self Care | Payer: Self-pay | Source: Ambulatory Visit | Attending: Family Medicine | Admitting: Family Medicine

## 2010-07-04 DIAGNOSIS — R209 Unspecified disturbances of skin sensation: Secondary | ICD-10-CM | POA: Insufficient documentation

## 2010-07-04 DIAGNOSIS — M67919 Unspecified disorder of synovium and tendon, unspecified shoulder: Secondary | ICD-10-CM | POA: Insufficient documentation

## 2010-07-04 DIAGNOSIS — M719 Bursopathy, unspecified: Secondary | ICD-10-CM | POA: Insufficient documentation

## 2010-07-04 DIAGNOSIS — M25511 Pain in right shoulder: Secondary | ICD-10-CM

## 2010-07-05 NOTE — Op Note (Signed)
Heidi Preston, Heidi Preston        ACCOUNT NO.:  [id number]   MEDICAL RECORD NO.:  [id number]          PATIENT TYPE:  AMB   LOCATION:  [facility]                          FACILITY:  [facility]   PHYSICIAN:  Feliberto Gottron. Turner Daniels, M.D.   DATE OF BIRTH:  March 23, 1940   DATE OF PROCEDURE:  04/22/2007  DATE OF DISCHARGE:                               OPERATIVE REPORT   PREOPERATIVE DIAGNOSIS:  Right shoulder massive rotator cuff tear  impingement.   POSTOPERATIVE DIAGNOSIS:  Right shoulder massive rotator cuff tear  impingement.   PROCEDURE:  Right shoulder arthroscopic anterior inferior acromioplasty,  distal clavicle spur excision, debridement of massive rotator cuff tear  off the greater tuberosity, it was already retracted beyond the glenoid  rim and then greater tuberoplasty.   SURGEON:  Feliberto Gottron. Turner Daniels, M.D.   FIRST ASSISTANT:  Erskine Squibb B. Su Hilt, P.A.C.   ANESTHESIA:  Interscalene block with general endotracheal anesthesia.   ESTIMATED BLOOD LOSS:  Minimal.   FLUIDS REPLACED:  800 mL Crystalloid.   DRAINS:  None.   TOURNIQUET TIME:  None.   INDICATIONS FOR PROCEDURE:  A 70 year old woman with plain radiograph  showing large subacromial and subclavicular spurs.  MRI scan showing  massive rotator cuff tear with classic impingement signs, rotator cuff  weakness. The rotator cuff tear is retracted beyond the glenoid rim with  muscle atrophy.  She is taken for arthroscopic decompression and  debridement of subacromial and subclavicular spurs as well as any fibers  remaining on the greater tuberosity that may be getting caught.  Risks  and benefits of surgery discussed.  Questions answered.  She has failed  conservative treatment with anti-inflammatory medicines, cortisone  injections and desires elective decompression of her right shoulder.  Again, a rotator cuff tear will not be entertained more likely than  secondary to age and the massive nature of the cuff tear.   DESCRIPTION OF PROCEDURE:   The patient underwent right shoulder  interscalene block in the block area at [facility] after being identified by arm band.  She was then  taken to the operating room where the appropriate anesthetic monitors  were attached and general endotracheal anesthesia induced with the  patient in the supine position.  She was then placed in the beach-chair  position and the right upper extremity prepped and draped in the usual  sterile fashion from the wrist to the hemithorax.  A timeout procedure  was then performed.  Using a #11 blade, standard portals were then made  1.5 cm anterior to the [facility] joint lateral to the junction middle and  posterior thirds of the acromion, posterior to the posterolateral corner  of the acromion process.  The inflow was placed anteriorly, the  arthroscope laterally and a 4.2 [place] su[place] shaver posteriorly.  We began the procedure by removing some subacromial bursa and also  identifying the massive rotator cuff tear.  Interestingly, the  glenohumeral cartilage was in good condition.  There was some minor  tearing of the labrum.  Using the ArthroCare wand, small bleeders were  identified and cauterized and we then set about  removing the  subclavicular and subacromial spurs requiring two full thickness passes  of the 4.5 hooded Vortex bur.  We then direct our attention to the  greater tuberosity which had large osteophytes.  These were removed with  the bur as were some remaining fibers of the supraspinatus insertion.  This was a greater tuberoplasty to smooth off the humeral head and  minimize impingement and __________ range of motion.  At this point, the  shoulder is irrigated out with normal saline solution.  The arthroscopic  instruments removed and a dressing of Xeroform, 4x4 dressing sponges,  paper tape and a sling applied.  The patient was laid supine, awakened  and taken to the recovery room without  difficulty.      Feliberto Gottron. Turner Daniels, M.D.  Electronically Signed     FJR/MEDQ  D:  04/22/2007  T:  04/22/2007  Job:  [id number]

## 2010-07-05 NOTE — Group Therapy Note (Signed)
NAMEGORDON, CARLSON        ACCOUNT NO.:  [id number]   MEDICAL RECORD NO.:  [id number]          PATIENT TYPE:  WOC   LOCATION:  [facility]                   FACILITY:  [facility]   PHYSICIAN:  Tinnie Gens, MD        DATE OF BIRTH:  1940-09-06   DATE OF SERVICE:  03/05/2008                                  CLINIC NOTE   CHIEF COMPLAINT:  Uterine prolapse.   HISTORY OF PRESENT ILLNESS:  The patient is a 70 year old gravida 4,  para 4, who [facility]degree uterine  prolapse.  The patient was previously not interested in pessaries and  wanted a surgical evaluation.  On presentation here, the patient reports  that her prolapse has been present for maybe the last year that she just  noticed it when she was showering that there was a bump inside the  vagina.  The patient has been menopausal since age 76.  Has had no  postmenopausal bleeding.  The patient denies that she has any lower  abdominal pain or pressure related to this event.   PAST MEDICAL HISTORY:  Significant for mild arthritis.   PAST SURGICAL HISTORY:  Cholecystectomy.   MEDICATIONS:  She is on calcium 500 mg p.o. daily.   ALLERGIES:  None known.   OBSTETRICAL HISTORY:  She is G4, P4, last baby was delivered 40 years  ago.  She had 4 vaginal delivery.  She is not sure of the weight.   GYNECOLOGIC HISTORY:  Menarche at age 46.  Menopause at age 39.  She did  have regular cycles during her lifetime.  Last Pap smear was in August  2009 and was normal.  She has no history of abnormal Pap.  Last  mammogram was November 2009 and also normal.   FAMILY HISTORY:  The patient denies diabetes, hypertension, or cancer.   SOCIAL HISTORY:  The patient does not work currently.  She denies  tobacco, alcohol or drug use 40 point review of systems reviewed.  Please see GYN history in the chart.  The patient denies nausea,  vomiting, diarrhea, constipation, abdominal pain, chest pain, shortness  of  breath, fevers, or chills.   PHYSICAL EXAMINATION:  VITAL SIGNS:  Her vitals were as noted in the  chart.  Blood pressure was mildly elevated 145/73, pulse was 86, weight  was 173.6.  GENERAL:  She is a well-developed, well-nourished female in no acute  distress.  GU:  Normal external female genitalia.  The vagina is pale and with loss  of rugation secondary to menopause.  The cervix is multiparous without  lesion.  Uterus is small, anteverted.  No adnexal mass or tenderness.  The bladder is fully supported.  She has a mild enterocele and the  uterus comes down to maybe 1-2 cm.  There is specifically no rectocele  or cystocele noted.   IMPRESSION:  Mild uterine prolapse.   PLAN:  Given the patient's lack of symptoms, I do not think that she  requires any sort of treatment at this time.  I did discuss with her if  she gets lower abdominal pain or pressure or if the uterus  prolapses  further and comes out, she is at risk for ulceration of the cervix or  vagina and that at that point surgical treatment could be undertaken.  The patient is understood pessary management, but is not really a  candidate for that at this time.  Thank you so much for referring this  patient.  She can follow with [place] as needed.           ______________________________  Tinnie Gens, MD     TP/MEDQ  D:  03/05/2008  T:  03/06/2008  Job:  [id number]

## 2010-07-27 DIAGNOSIS — H25019 Cortical age-related cataract, unspecified eye: Secondary | ICD-10-CM | POA: Insufficient documentation

## 2010-07-27 DIAGNOSIS — H04129 Dry eye syndrome of unspecified lacrimal gland: Secondary | ICD-10-CM | POA: Insufficient documentation

## 2010-10-06 ENCOUNTER — Inpatient Hospital Stay (INDEPENDENT_AMBULATORY_CARE_PROVIDER_SITE_OTHER)
Admission: RE | Admit: 2010-10-06 | Discharge: 2010-10-06 | Disposition: A | Discharge status: Home / Self Care | Payer: Medicaid Other | Source: Ambulatory Visit | Attending: Family Medicine | Admitting: Family Medicine

## 2010-10-06 DIAGNOSIS — K5289 Other specified noninfective gastroenteritis and colitis: Secondary | ICD-10-CM

## 2010-10-17 ENCOUNTER — Inpatient Hospital Stay (INDEPENDENT_AMBULATORY_CARE_PROVIDER_SITE_OTHER)
Admission: RE | Admit: 2010-10-17 | Discharge: 2010-10-17 | Disposition: A | Discharge status: Home / Self Care | Payer: Medicaid Other | Source: Ambulatory Visit | Attending: Family Medicine | Admitting: Family Medicine

## 2010-10-17 DIAGNOSIS — R51 Headache: Secondary | ICD-10-CM

## 2010-10-17 DIAGNOSIS — I495 Sick sinus syndrome: Secondary | ICD-10-CM

## 2010-10-17 LAB — POCT I-STAT, CHEM 8
BUN: 13 mg/dL (ref 6–23)
Calcium, Ion: 1.2 mmol/L (ref 1.12–1.32)
Chloride: 102 mEq/L (ref 96–112)
Creatinine, Ser: 0.7 mg/dL (ref 0.50–1.10)
Glucose, Bld: 102 mg/dL — ABNORMAL HIGH (ref 70–99)
Potassium: 4.2 mEq/L (ref 3.5–5.1)
Sodium: 139 mEq/L (ref 135–145)

## 2010-10-17 LAB — POCT URINALYSIS DIP (DEVICE)
Glucose, UA: NEGATIVE mg/dL
Ketones, ur: NEGATIVE mg/dL
Protein, ur: NEGATIVE mg/dL
pH: 7 (ref 5.0–8.0)

## 2010-11-14 LAB — POCT HEMOGLOBIN-HEMACUE: Hemoglobin: 13.9

## 2011-05-05 ENCOUNTER — Other Ambulatory Visit: Payer: Self-pay | Admitting: Gastroenterology

## 2011-05-09 ENCOUNTER — Encounter: Payer: Self-pay | Admitting: Pulmonary Disease

## 2011-05-10 ENCOUNTER — Ambulatory Visit (INDEPENDENT_AMBULATORY_CARE_PROVIDER_SITE_OTHER): Payer: Medicare Other | Admitting: Pulmonary Disease

## 2011-05-10 ENCOUNTER — Encounter: Payer: Self-pay | Admitting: Pulmonary Disease

## 2011-05-10 ENCOUNTER — Ambulatory Visit (INDEPENDENT_AMBULATORY_CARE_PROVIDER_SITE_OTHER)
Admission: RE | Admit: 2011-05-10 | Discharge: 2011-05-10 | Disposition: A | Discharge status: Home / Self Care | Payer: Medicare Other | Source: Ambulatory Visit | Attending: Pulmonary Disease | Admitting: Pulmonary Disease

## 2011-05-10 VITALS — BP 164/104 | HR 90 | Temp 98.3°F | Ht 64.0 in | Wt 178.6 lb

## 2011-05-10 DIAGNOSIS — R059 Cough, unspecified: Secondary | ICD-10-CM

## 2011-05-10 DIAGNOSIS — R05 Cough: Secondary | ICD-10-CM

## 2011-05-10 MED ORDER — BENZONATATE 100 MG PO CAPS: 200.0000 mg | ORAL_CAPSULE | Freq: Four times a day (QID) | ORAL | Status: DC | PRN

## 2011-05-10 MED ORDER — OMEPRAZOLE 40 MG PO CPDR: 40.0000 mg | DELAYED_RELEASE_CAPSULE | Freq: Two times a day (BID) | ORAL | Status: DC

## 2011-05-10 NOTE — Patient Instructions (Signed)
Will start omeprazole 40mg  one in am and pm before eating.  This is for possible acid reflux causing cough Take chlorpheniramine 8mg  at bedtime every night for next 3 weeks for possible nasal drip Take tessalon pearls 100mg  2 every 6hrs if needed for cough Minimize throat clearing, minimize voice use as much as possible, no singing or yelling. Keep hard candy, (no mint/menthol/cough drops) in mouth all during the day to keep cough controlled.  Will check cxr today, and will call you with results.

## 2011-05-10 NOTE — Progress Notes (Signed)
  Subjective:    Patient ID: Heidi Preston, female    DOB: Nov 16, 1940, 71 y.o.   MRN: [id number]  HPI The patient is a 71 year old female who I've been asked to see for chronic cough.  The patient does not speak English, and history is obtained through an interpreter.  The patient relates a cough for 2 years, and feels that it is getting worse.  It is dry in nature, and she does have a tickle in her throat that seems to be the stimulus for the cough.  She does not think that she has frequent throat clearing, but does have cough paroxysms which last for minutes at a time.  She denies postnasal drip or sinus congestion.  She also denies reflux symptoms, but has been treated in the past with low dose omeprazole.  She denies any chest congestion or shortness of breath.  She has no history of asthma and has never smoked.  She has not had spirometry or a recent chest x-ray.   Review of Systems  Constitutional: Negative for fever and unexpected weight change.  HENT: Positive for sneezing. Negative for ear pain, nosebleeds, congestion, sore throat, rhinorrhea, trouble swallowing, dental problem, postnasal drip and sinus pressure.   Eyes: Negative for redness and itching.  Respiratory: Positive for cough. Negative for chest tightness, shortness of breath and wheezing.   Cardiovascular: Negative for palpitations and leg swelling.  Gastrointestinal: Negative for nausea and vomiting.  Genitourinary: Negative for dysuria.  Musculoskeletal: Negative for joint swelling.  Skin: Negative for rash.  Neurological: Positive for headaches.  Hematological: Does not bruise/bleed easily.  Psychiatric/Behavioral: Negative for dysphoric mood. The patient is not nervous/anxious.        Objective:   Physical Exam Constitutional:  Well developed, no acute distress  HENT:  Nares patent without discharge  Oropharynx without exudate, palate and uvula are normal  Eyes:  Perrla, eomi, no scleral icterus  Neck:  No  JVD, no TMG  Cardiovascular:  Normal rate, regular rhythm, no rubs or gallops.  No murmurs        Intact distal pulses  Pulmonary :  Normal breath sounds, no stridor or respiratory distress   No rales, rhonchi, or wheezing  Abdominal:  Soft, nondistended, bowel sounds present.  No tenderness noted.   Musculoskeletal:  No lower extremity edema noted.  Lymph Nodes:  No cervical lymphadenopathy noted  Skin:  No cyanosis noted  Neurologic:  Alert, appropriate, moves all 4 extremities without obvious deficit.         Assessment & Plan:

## 2011-05-10 NOTE — Assessment & Plan Note (Signed)
The patient has a chronic cough of 2 years duration that I suspect is upper airway in origin.  Her lung fields are clear, and her spirometry is normal.  Will check a chest x-ray today for completeness.  The most common causes for this are reflux disease, postnasal drip, and cyclical coughing.  I would like to treat the patient for these entities, and see if she improves.

## 2011-05-11 ENCOUNTER — Emergency Department (HOSPITAL_COMMUNITY)
Admission: EM | Admit: 2011-05-11 | Discharge: 2011-05-12 | Disposition: A | Discharge status: Home / Self Care | Payer: Medicare Other | Attending: Emergency Medicine | Admitting: Emergency Medicine

## 2011-05-11 ENCOUNTER — Encounter (HOSPITAL_COMMUNITY): Payer: Self-pay | Admitting: Emergency Medicine

## 2011-05-11 ENCOUNTER — Emergency Department (INDEPENDENT_AMBULATORY_CARE_PROVIDER_SITE_OTHER)
Admission: EM | Admit: 2011-05-11 | Discharge: 2011-05-11 | Disposition: A | Discharge status: Nursing Home (Includes ICF) | Payer: Medicare Other | Source: Home / Self Care | Attending: Family Medicine | Admitting: Family Medicine

## 2011-05-11 ENCOUNTER — Telehealth: Payer: Self-pay | Admitting: Pulmonary Disease

## 2011-05-11 DIAGNOSIS — R2681 Unsteadiness on feet: Secondary | ICD-10-CM

## 2011-05-11 DIAGNOSIS — R269 Unspecified abnormalities of gait and mobility: Secondary | ICD-10-CM

## 2011-05-11 DIAGNOSIS — G43909 Migraine, unspecified, not intractable, without status migrainosus: Secondary | ICD-10-CM | POA: Insufficient documentation

## 2011-05-11 DIAGNOSIS — I10 Essential (primary) hypertension: Secondary | ICD-10-CM | POA: Insufficient documentation

## 2011-05-11 DIAGNOSIS — R5381 Other malaise: Secondary | ICD-10-CM

## 2011-05-11 DIAGNOSIS — R51 Headache: Secondary | ICD-10-CM

## 2011-05-11 DIAGNOSIS — R531 Weakness: Secondary | ICD-10-CM

## 2011-05-11 NOTE — ED Notes (Addendum)
Chronic headache that exacerbated today. Pounding all over. She's been having dizziness, nausea, and general weakness. No vomiting/diarrhea. Neuro fast stroke exam performed and she passed. Oriented x 4. No SOB or chest pain.

## 2011-05-11 NOTE — ED Notes (Signed)
PT. TRANSFERRED FROM [facility] THIS EVENING FOR FURTHER EVALUATION OF HEADACHE , NAUSEA AND DIZZINESS / UNSTEADY GAIT ONSET TODAY  UNRELIEVED BY OTC MEDICATIONS .

## 2011-05-11 NOTE — Discharge Instructions (Signed)
Transferred to Emergency Department for further evaluation of headache, dizziness, weakness.

## 2011-05-11 NOTE — ED Provider Notes (Signed)
History     CSN: [id number]  Arrival date & time 05/11/11  1747   First MD Initiated Contact with Patient 05/11/11 1753      Chief Complaint  Patient presents with  . Headache    (Consider location/radiation/quality/duration/timing/severity/associated sxs/prior treatment) HPI Comments: Heidi Preston presents for evaluation of a constant headache, dizziness, unsteady gait, nausea, and weakness. Her daughter reports, that she's had intermittent headaches, over the last year or so. However, days ago, way or treated with over-the-counter medication. Today, however, the headache is worse and did not resolve after administration of Advil. She also reports weakness over her entire body, nausea, and unsteady gait. She has not vomited. [facility], she had a positive Romberg, and weakness throughout all extremities. She is also unsteady on her feet while the provider watched her ambulate. Funduscopic exam was unremarkable.  Patient is a 71 y.o. female presenting with headaches. The history is provided by the patient and a relative. The history is limited by a language barrier. No language interpreter was used (daughter translated).  Headache The primary symptoms include headaches, dizziness and nausea. Primary symptoms do not include syncope, loss of consciousness, paresthesias, fever or vomiting. The symptoms began 6 to 12 hours ago. The symptoms are unchanged. The neurological symptoms are diffuse.  The headache began today. The headache developed suddenly. Headache is a recurrent problem. The headache is present continuously. The pain from the headache is at a severity of 10/10. The headache is associated with weakness and loss of balance. The headache is not associated with neck stiffness or paresthesias.  The dizziness began today. The dizziness has been unchanged since its onset. It is a new problem. Dizziness also occurs with nausea and weakness. Dizziness does not occur with  vomiting.  Nausea began today.  Additional symptoms include weakness and loss of balance. Additional symptoms do not include neck stiffness.    Past Medical History  Diagnosis Date  . Anxiety   . Arthritis   . Hypertension   . Blepharitis, both eyes   . Esophagitis, reflux   . Hypercholesteremia   . Fatigue   . Hypothyroid     Past Surgical History  Procedure Date  . Total abdominal hysterectomy   . Cholecystectomy   . Shoulder surgery     No family history on file.  History  Substance Use Topics  . Smoking status: Never Smoker   . Smokeless tobacco: Not on file  . Alcohol Use: No    OB History    Grav Para Term Preterm Abortions TAB SAB Ect Mult Living                  Review of Systems  Constitutional: Negative for fever.  HENT: Negative for neck stiffness.   Eyes: Negative.   Respiratory: Negative.   Cardiovascular: Negative.  Negative for syncope.  Gastrointestinal: Positive for nausea. Negative for vomiting.  Genitourinary: Negative.   Musculoskeletal: Positive for gait problem.  Skin: Negative.   Neurological: Positive for dizziness, weakness, headaches and loss of balance. Negative for loss of consciousness, syncope and paresthesias.  Psychiatric/Behavioral: Negative.     Allergies  Review of patient's allergies indicates no known allergies.  Home Medications   Current Outpatient Rx  Name Route Sig Dispense Refill  . BENZONATATE 100 MG PO CAPS Oral Take 2 capsules (200 mg total) by mouth every 6 (six) hours as needed for cough. 30 capsule 1  . CELECOXIB 200 MG PO CAPS Oral Take  200 mg by mouth daily.    Marland Kitchen HYDROCHLOROTHIAZIDE 25 MG PO TABS Oral Take 25 mg by mouth daily.    Marland Kitchen OMEPRAZOLE 40 MG PO CPDR Oral Take 1 capsule (40 mg total) by mouth 2 (two) times daily. 60 capsule 0    BP 184/94  Pulse 67  Temp(Src) 98.5 F (36.9 C) (Oral)  Resp 16  SpO2 96%  Physical Exam  Nursing note and vitals reviewed. Constitutional: She is oriented to  person, place, and time. She appears well-developed and well-nourished.  HENT:  Head: Normocephalic and atraumatic.  Mouth/Throat: Uvula is midline, oropharynx is clear and moist and mucous membranes are normal.  Eyes: EOM and lids are normal. Pupils are equal, round, and reactive to light.  Fundoscopic exam:      The right eye shows no arteriolar narrowing, no AV nicking, no hemorrhage and no papilledema.       The left eye shows no arteriolar narrowing, no hemorrhage and no papilledema.  Neck: Normal range of motion.  Cardiovascular: Normal rate, regular rhythm and normal pulses.   Murmur heard.  Systolic murmur is present with a grade of 1/6  Pulmonary/Chest: Effort normal and breath sounds normal. She has no decreased breath sounds. She has no wheezes. She has no rhonchi.  Musculoskeletal: Normal range of motion.  Neurological: She is alert and oriented to person, place, and time. No cranial nerve deficit or sensory deficit. Gait abnormal. Coordination normal. GCS eye subscore is 4. GCS verbal subscore is 5. GCS motor subscore is 6.       4/5 strength throughout extremities; could be poor effort  Skin: Skin is warm and dry.  Psychiatric: Her behavior is normal.    ED Course  Procedures (including critical care time)  Labs Reviewed - No data to display Dg Chest 2 View  05/10/2011  *RADIOLOGY REPORT*  Clinical Data: Chronic cough.  CHEST - 2 VIEW  Comparison: PA and lateral chest 10/06/2009.  Findings: Nodular opacity in the periphery of the left lung base at the costophrenic angle is again seen and appears slightly larger measuring 2.6 cm cranial-caudal compared to 0.2 cm on the prior study.  Lungs otherwise appear clear.  Heart size is normal.  No pneumothorax identified.  Old left rib fractures are noted.  IMPRESSION:  Slight increase in a peripheral nodular opacity in the left lung base.  This finding cannot be definitively characterized.  Chest CT with contrast recommended for further  evaluation.  The finding may be related to old left rib fractures.  Original Report Authenticated By: Bernadene Bell. D'ALESSIO, M.D.     1. Headache   2. Unsteady gait   3. Weakness generalized       MDM  Transferred to ED for further evaluation        Renaee Munda, MD 05/11/11 [phone]

## 2011-05-11 NOTE — Telephone Encounter (Signed)
Received copies from [facility],on 05/11/11. Forwarded 1  pages to [facility] ,for review.

## 2011-05-12 ENCOUNTER — Emergency Department (HOSPITAL_COMMUNITY): Payer: Medicare Other

## 2011-05-12 ENCOUNTER — Other Ambulatory Visit: Payer: Self-pay | Admitting: Pulmonary Disease

## 2011-05-12 DIAGNOSIS — R9389 Abnormal findings on diagnostic imaging of other specified body structures: Secondary | ICD-10-CM

## 2011-05-12 LAB — URINALYSIS, ROUTINE W REFLEX MICROSCOPIC
Bilirubin Urine: NEGATIVE
Glucose, UA: NEGATIVE mg/dL
Ketones, ur: NEGATIVE mg/dL
Nitrite: NEGATIVE
Protein, ur: NEGATIVE mg/dL
pH: 7 (ref 5.0–8.0)

## 2011-05-12 LAB — HEPATIC FUNCTION PANEL
ALT: 47 U/L — ABNORMAL HIGH (ref 0–35)
AST: 37 U/L (ref 0–37)
Bilirubin, Direct: <0.1 mg/dL (ref 0.0–0.3)
Total Bilirubin: 0.4 mg/dL (ref 0.3–1.2)

## 2011-05-12 LAB — DIFFERENTIAL
Basophils Absolute: 0 10*3/uL (ref 0.0–0.1)
Basophils Relative: 0 % (ref 0–1)
Eosinophils Absolute: 0.1 10*3/uL (ref 0.0–0.7)
Monocytes Relative: 7 % (ref 3–12)
Neutro Abs: 5.7 10*3/uL (ref 1.7–7.7)
Neutrophils Relative %: 65 % (ref 43–77)

## 2011-05-12 LAB — CBC
Hemoglobin: 12.6 g/dL (ref 12.0–15.0)
MCH: 31.2 pg (ref 26.0–34.0)
MCHC: 34.4 g/dL (ref 30.0–36.0)
Platelets: 284 10*3/uL (ref 150–400)
RDW: 13 % (ref 11.5–15.5)

## 2011-05-12 LAB — BASIC METABOLIC PANEL
BUN: 13 mg/dL (ref 6–23)
Chloride: 100 mEq/L (ref 96–112)
GFR calc Af Amer: >90 mL/min (ref >90)
GFR calc non Af Amer: 87 mL/min — ABNORMAL LOW (ref >90)
Potassium: 4.1 mEq/L (ref 3.5–5.1)

## 2011-05-12 LAB — APTT: aPTT: 38 seconds — ABNORMAL HIGH (ref 24–37)

## 2011-05-12 LAB — PROTIME-INR: Prothrombin Time: 13.5 seconds (ref 11.6–15.2)

## 2011-05-12 MED ORDER — IBUPROFEN 600 MG PO TABS: 600.0000 mg | ORAL_TABLET | Freq: Three times a day (TID) | ORAL | Status: AC | PRN

## 2011-05-12 MED ORDER — SODIUM CHLORIDE 0.9 % IV BOLUS (SEPSIS)
1000.0000 mL | Freq: Once | INTRAVENOUS | Status: AC
  Administered 2011-05-12: 1000 mL via INTRAVENOUS

## 2011-05-12 MED ORDER — SODIUM CHLORIDE 0.9 % IV SOLN: INTRAVENOUS | Status: DC

## 2011-05-12 MED ORDER — PROMETHAZINE HCL 25 MG/ML IJ SOLN
25.0000 mg | Freq: Once | INTRAMUSCULAR | Status: AC
  Administered 2011-05-12: 25 mg via INTRAVENOUS
  Filled 2011-05-12: qty 1

## 2011-05-12 MED ORDER — METHYLPREDNISOLONE SODIUM SUCC 125 MG IJ SOLR
125.0000 mg | Freq: Once | INTRAMUSCULAR | Status: AC
  Administered 2011-05-12: 125 mg via INTRAVENOUS
  Filled 2011-05-12: qty 2

## 2011-05-12 MED ORDER — DIPHENHYDRAMINE HCL 50 MG/ML IJ SOLN
25.0000 mg | Freq: Once | INTRAMUSCULAR | Status: AC
  Administered 2011-05-12: 25 mg via INTRAVENOUS
  Filled 2011-05-12: qty 1

## 2011-05-12 MED ORDER — ONDANSETRON HCL 4 MG/2ML IJ SOLN
4.0000 mg | Freq: Once | INTRAMUSCULAR | Status: AC
  Administered 2011-05-12: 4 mg via INTRAVENOUS

## 2011-05-12 MED ORDER — PROMETHAZINE HCL 25 MG PO TABS: 25.0000 mg | ORAL_TABLET | Freq: Four times a day (QID) | ORAL | Status: DC | PRN

## 2011-05-12 NOTE — ED Notes (Signed)
Pt to ED c/o severe headache and nausea today.  Pt states hx of headaches, but nothing like today.  Pt appears in great pain.  BP is elevated and pt has been bp meds for 1 week d/t some sort of abd surgery she is to have soon.  Denies changes in vision.  No facial droop or unilateral weaknesses.  Pt states unsteady gate that is associated with pain.

## 2011-05-12 NOTE — ED Provider Notes (Signed)
History     CSN: [id number]  Arrival date & time 05/11/11  2002   First MD Initiated Contact with Patient 05/11/11 2354      Chief Complaint  Patient presents with  . Headache    (Consider location/radiation/quality/duration/timing/severity/associated sxs/prior treatment) HPI Comments: The patient has a history of recurrent headaches for which this one is similar other than that it is accompanied by nausea and more severe than usual, but character of the headache, dull, throbbing, bilateral frontal and occipital in distribution is similar to her prior headaches. She denies any focal numbness or weakness in her arms or legs.  Patient is a 71 y.o. female presenting with headaches. The history is provided by the patient and a relative. A language interpreter was used.  Headache  This is a recurrent problem. Episode onset: This morning on awakening. The problem occurs constantly. The problem has been gradually worsening (The headache began mild in the morning and gradually progressed to become more severe across the afternoon, not responding to usual treatments at home.). The headache is associated with bright light and loud noise (Pain is worse with bright light or noise). The pain is located in the bilateral, frontal and occipital region. The quality of the pain is described as dull. The pain is severe. The pain does not radiate. Associated symptoms include malaise/fatigue and nausea. Pertinent negatives include no anorexia, no fever, no chest pressure, no near-syncope, no orthopnea, no palpitations, no syncope, no shortness of breath and no vomiting. She has tried NSAIDs for the symptoms. The treatment provided mild relief.    Past Medical History  Diagnosis Date  . Anxiety   . Arthritis   . Hypertension   . Blepharitis, both eyes   . Esophagitis, reflux   . Hypercholesteremia   . Fatigue   . Hypothyroid     Past Surgical History  Procedure Date  . Total abdominal hysterectomy   .  Cholecystectomy   . Shoulder surgery     No family history on file.  History  Substance Use Topics  . Smoking status: Never Smoker   . Smokeless tobacco: Not on file  . Alcohol Use: No    OB History    Grav Para Term Preterm Abortions TAB SAB Ect Mult Living                  Review of Systems  Constitutional: Positive for malaise/fatigue and fatigue. Negative for fever, chills, diaphoresis and appetite change.  HENT: Negative for hearing loss, ear pain, congestion, sore throat, facial swelling, rhinorrhea, trouble swallowing, neck pain, neck stiffness, dental problem, sinus pressure and tinnitus.   Eyes: Positive for photophobia. Negative for pain, discharge, redness and visual disturbance.  Respiratory: Negative.  Negative for cough and shortness of breath.   Cardiovascular: Negative.  Negative for chest pain, palpitations, orthopnea, syncope and near-syncope.  Gastrointestinal: Positive for nausea. Negative for vomiting, abdominal pain, abdominal distention and anorexia.  Genitourinary: Negative.   Musculoskeletal: Negative for back pain.  Skin: Negative for rash.  Neurological: Positive for light-headedness and headaches. Negative for dizziness, seizures, syncope and numbness.  Hematological: Negative for adenopathy. Does not bruise/bleed easily.  Psychiatric/Behavioral: Negative for confusion.    Allergies  Review of patient's allergies indicates no known allergies.  Home Medications   Current Outpatient Rx  Name Route Sig Dispense Refill  . IBUPROFEN 200 MG PO TABS Oral Take 400 mg by mouth every 6 (six) hours as needed.      BP 160/80  Pulse 74  Temp(Src) 97 F (36.1 C) (Oral)  Resp 30  SpO2 98%  Physical Exam  Constitutional: She is oriented to person, place, and time. She appears well-developed and well-nourished. She appears distressed.  HENT:  Head: Normocephalic and atraumatic.  Right Ear: External ear normal.  Left Ear: External ear normal.    Mouth/Throat: Oropharynx is clear and moist. No oropharyngeal exudate.  Eyes: Conjunctivae and EOM are normal. Pupils are equal, round, and reactive to light. Right eye exhibits no nystagmus. Left eye exhibits no nystagmus.  Fundoscopic exam:      The right eye shows no papilledema.       The left eye shows no papilledema.  Neck: Normal range of motion, full passive range of motion without pain and phonation normal. Neck supple. Carotid bruit is not present.  Cardiovascular: Normal rate, regular rhythm, normal heart sounds and intact distal pulses.  Exam reveals no gallop and no friction rub.   No murmur heard. Pulmonary/Chest: Effort normal and breath sounds normal. No respiratory distress. She has no wheezes. She has no rales.  Abdominal: Soft. Bowel sounds are normal. She exhibits no distension. There is no tenderness. There is no rebound and no guarding.  Musculoskeletal: Normal range of motion. She exhibits no edema and no tenderness.  Neurological: She is alert and oriented to person, place, and time. She has normal reflexes. She displays no tremor. No cranial nerve deficit or sensory deficit. She exhibits normal muscle tone. She displays a negative Romberg sign. Coordination and gait normal. GCS eye subscore is 4. GCS verbal subscore is 5. GCS motor subscore is 6.       Normal visual fields to examination, no nystagmus or vertigo elicited with head movement, normal coordination and normal steady gait.  Non-focal neurologic evaluation.  Skin: Skin is warm and dry. No rash noted. She is not diaphoretic.  Psychiatric: She has a normal mood and affect.    ED Course  Procedures (including critical care time)  Labs Reviewed  BASIC METABOLIC PANEL - Abnormal; Notable for the following:    Glucose, Bld 117 (*)    GFR calc non Af Amer 87 (*)    All other components within normal limits  HEPATIC FUNCTION PANEL - Abnormal; Notable for the following:    ALT 47 (*)    All other components  within normal limits  CBC  DIFFERENTIAL  LIPASE, BLOOD  PROTIME-INR  APTT  URINALYSIS, ROUTINE W REFLEX MICROSCOPIC   Dg Chest 2 View  05/10/2011  *RADIOLOGY REPORT*  Clinical Data: Chronic cough.  CHEST - 2 VIEW  Comparison: PA and lateral chest 10/06/2009.  Findings: Nodular opacity in the periphery of the left lung base at the costophrenic angle is again seen and appears slightly larger measuring 2.6 cm cranial-caudal compared to 0.2 cm on the prior study.  Lungs otherwise appear clear.  Heart size is normal.  No pneumothorax identified.  Old left rib fractures are noted.  IMPRESSION:  Slight increase in a peripheral nodular opacity in the left lung base.  This finding cannot be definitively characterized.  Chest CT with contrast recommended for further evaluation.  The finding may be related to old left rib fractures.  Original Report Authenticated By: Bernadene Bell. Maricela Curet, M.D.     No diagnosis found.    MDM  The patient has not had any focal neurologic findings, incoordination, nystagmus, vertigo, focal numbness or weakness, and no recent history of head injury, no fever chills or rash to suggest  an infectious etiology, and a gradual onset of headache not suggestive of subarachnoid hemorrhage. We will obtain a CT scan to evaluate further because this headache is more severe than usual, however anticipate that it will show no focal neurologic lesion and the patient will be be discharged home. At this time after initial treatment with medications the patient states she is feeling significant relief.        Felisa Bonier, MD 05/12/11 [phone]

## 2011-05-12 NOTE — Discharge Instructions (Signed)
Dolor de Training and development officer, preguntas frecuentes y sus respuestas (Headaches, Frequently Asked Questions) CEFALEAS MIGRAOSAS P: Qu es la migraa? Qu la ocasiona? Cmo puedo tratarla? R: En general, la migraa comienza como un dolor apagado. Luego progresa hacia un dolor, constante, punzante y como un latido. Sentir Scientist, physiological las sienes. Podr sentir Radiographer, therapeutic parte anterior o posterior de la cabeza, o en uno o ambos lados. El dolor suele estar acompaado de una combinacin de:  Nuseas.   Vmitos.   Sensibilidad a la luz y los ruidos.  Algunas personas (un 15%) experimentan un aura (ver abajo) antes de un ataque. La causa de la migraa se debe a reacciones qumicas del cerebro. El tratamiento para la migraa puede incluir medicamentos de [place]. Tambin puede incluir tcnicas de [place]. Estas incluyen entrenamientos para la relajacin y biorretroalimentacin.  P: Qu es un aura? R: Alrededor del 15% de las personas con migraa tiene un "aura". Es una seal de sntomas neurolgicos que ocurren antes de un dolor de cabeza por migraas. Podr ver lneas onduladas o irregulares, puntos o luces parpadeantes. Podr experimentar visin de tnel o puntos ciegos en uno o ambos ojos. El aura puede incluir alucinaciones visuales o auditivas (algo que se imagina). Puede incluir trastornos en el olfato (como olores extraos), el tacto o el gusto. Entre otros sntomas se incluyen:  Adormecimiento.   Sensacin de hormigueo.   Dificultad para recordar o Occupational hygienist.  Estos episodios neurolgicos pueden durar hasta 60 minutos. Los sntomas desaparecern a medida que el dolor de cabeza comience. P:Qu es un disparador? R: Ciertos factores fsicos o Sports administrator a "disparar" una migraa. Estos son:  Alimentos.   Cambios hormonales.   Clima.   Estrs.  Es importante recordar que los disparadores son diferentes entre si. Para ayudar a prevenir ataques de migraas,  necesitar descubrir cules son los Psychologist, prison and probation services. Lleve un diario sobre sus dolores de [place]. Este es un buen modo para descubrir los disparadores. El Sales executive en el momento de hablar con el profesional acerca de su enfermedad. P: El clima afecta en las migraas? R: La luz solar, el calor, la humedad y lo cambios drsticos en la presin Software engineer a, o "disparar" un ataque de migraa en [employer]. Pero estudios han demostrado que el clima no acta como disparador para todas las personas con [place]. P: Cul es la relacin entre la migraa y la hormonas? R: Las hormonas inician y regulan muchas de las funciones corporales. Las hormonas [employer] balance en el cuerpo dentro de los constantes cambios de [place]. Algunas veces, el nivel de hormonas en el cuerpo se desbalancea. Por ejemplo, durante la menstruacin, el embarazo o la [place]. Pueden ser la causa de un ataque de migraa. De hecho, alrededor de tres cuartos de las mujeres con migraa informan que sus ataques estn relacionados con el ciclo menstrual.  P: Aumenta el riesgo de sufrir un choque cardaco en las personas que padecen migraa? R: La probabilidad de que un ataque de migraa ocasione un ataque cardaco es muy remota. Esto no quiere [employer] persona que sufre de migraa no pueda tener un ataque cardaco asociado con ella. En las personas menores de 40 aos, el factor ms comn para un ataque es la [place]. Pero durante la vida de una persona, la ocurrencia de un dolor de cabeza por migraa est asociada con una reduccin en el riesgo de morir por un ataque cerebrovascular.  P: Cules son los medicamentos para la migraa? R: La medicacin precisa se [place] para tratar el dolor de cabeza una vez que ha comenzado. Son ejemplos, medicamentos de [place], desinflamatorios sin esteroides, ergotamnicos y triptanos.  P: Qu son los triptanos? R: Lo triptanos son Yaakov Guthrie clase de  medicamentos abortivos. Son especficos para tratar este problema. Los triptanos son vasoconstrictores. Moderan algunas reacciones qumicas del cerebro. Los triptanos trabajan como receptores del cerebro. Ayudan a Warehouse manager de un neurotransmisor denominado serotonina. Se cree que las fluctuaciones en los [place] de serotonina son la causa principal de la migraa.  P: Son [employer] de venta libre para la migraa? R: Los medicamentos de [employer] pueden ser efectivos para Paramedic dolores leves a moderados y los sntomas asociados a la [place]. Pero deber consultar a un mdico antes de Charity fundraiser tratamiento para la migraa.  P: Cules son los medicamentos de prevencin de la migraa? R: Se suele denominar tratamiento "profilctico" a los medicamentos para la prevencin de la migraa. Se utilizan para reducir Print production planner, gravedad y duracin de los ataques de [place]. Son ejemplos de medicamentos de prevencin: antiepilpticos, antidepresivos, bloqueadores beta, bloqueadores de los canales de calcio y medicamentos antiinflamatorios sin esteroides. P:  Por qu se utilizan anticonvulsivantes para tratar la migraa? R: Durante los ltimos aos, ha habido un creciente inters en las drogas antiepilpticas para la prevencin de la migraa. A menudo se los conoce como "anticonvulsivantes". La epilepsia y la migraa suceden por reacciones similares en el cerebro.  P:  Por qu se utilizan antidepresivos para tratar la migraa? R: Los antidepresivos tpicamente se [place] para tratar a las personas con depresin. Pueden reducir la frecuencia de la migraa a travs de la regulacin de los niveles qumicos, como la serotonina, en el cerebro.  P:  Por qu se utilizan terapias alternativas para tratar la migraa? R: El trmino "terapias alternativas" suelen utilizarse para describir los tratamientos que se considera que estn por fuera de Occupational hygienist la medicina occidental  convencional. Son ejemplos de las terapias alternativas: la acupuntura, la acupresin y el yoga. Otra terapia alternativa comn es la terapia herbal. Se cree que algunas hierbas ayudan a [employer] de [place]. Siempre consulte con [employer] acerca de las terapias alternativas antes de [place]. Algunos productos herbales contienen arsnico y [place] toxinas. DOLORES DE CABEZA POR TENSIN P: Qu es un dolor de cabeza por tensin? Qu lo ocasiona? Cmo puedo tratarlo? R: Los dolores de cabeza por tensin ocurren al azar. A menudo son el resultado de estrs temporario, ansiedad, fatiga o ira. Los sntomas Environmental education officer en las sienes, una sensacin como de tener una banda alrededor de la cabeza (un dolor que "presiona"). Los sntomas pueden incluir una sensacin de [place], de presin y Engineer, materials de los msculos de la cabeza y el cuello. El dolor comienza en la frente, sienes o en la parte posterior de la cabeza y el cuello. El tratamiento para los dolores de cabeza por tensin puede incluir medicamentos de [place]. Tambin puede incluir tcnicas de [place] con entrenamientos para la relajacin y biorretroalimentacin. CEFALEA EN RACIMOS P: Qu es una cefalea en racimos? Qu la ocasiona? Cmo puedo tratarla? R: La cefalea en racimos toma su nombre debido a que los ataques vienen en grupos. El dolor aparece con poco o ningn aviso. Normalmente ocurre de un lado de la cabeza. Muchas veces el dolor viene acompaado de un lagrimeo u ojo rojo y goteo de la nariz del mismo  lado que Chief Technology Officer. Se cree que la causa es una reaccin en las sustancias qumicas del cerebro. Se describe como el caso ms grave e intenso de cualquier tipo de dolor de cabeza. El tratamiento incluye medicamentos bajo receta y oxgeno. CEFALEA SINUSAL P: Qu es una cefalea sinusal? Qu la ocasiona? Cmo puedo tratarla? R: Cuando se inflama una cavidad en los huesos de la cara y el crneo (sinus) ocasiona un dolor  localizado. Esta enfermedad generalmente es el resultado de una reaccin alrgica, un tumor o una infeccin. Si el dolor de cabeza est ocasionado por un bloqueo del sinus, como una infeccin, probablemente tendr [place]. Una imagen de rayos X confirmar el bloqueo del sinus. El tratamiento indicado por el mdico podr incluir antibiticos para la infeccin, y tambin antihistamnicos o descongestivos.  DOLOR DE CABEZA POR EFECTO "REBOTE" P: Qu es un dolor de cabeza por efecto "rebote"? Qu lo ocasiona? Cmo puedo tratarlo? R: Si se toman medicamentos para el dolor de cabeza muy a menudo puede llevar a la enfermedad conocida como "dolor de cabeza por rebote". Un patrn de abuso de medicamentos para el dolor de cabeza supone tomarlos ms de [employer] por semana o en cantidades excesivas. Esto significa ms que lo que indica el envase o el mdico. Con los dolores de cabeza por rebote, los medicamentos no slo dejan de Engineer, materials sino que adems comienzan a Data processing manager dolores de [place]. Los mdicos tratan los dolores de cabeza por rebote mediante la disminucin del medicamento del que se ha abusado. A veces el medico podr sustituir gradualmente por un tipo diferente de tratamiento o medicacin. Dejar de consumirlo podra ser difcil. El abuso regular de un medicamento aumenta el potencial que se produzcan efectos secundarios graves. Consulte con un mdico si utiliza regularmente medicamentos para Chief Technology Officer de cabeza ms de [address] por semana o ms de lo que indica el envase. PREGUNTAS Y RESPUESTAS ADICIONALES P: Qu es la biorretroalimentacin? R: La biorretroalimentacin es un tratamiento de [place]. La biorretroalimentacin utiliza un equipamiento especial para controlar los movimientos involuntarios del cuerpo y las respuestas fsicas. La biorretroalimentacin controla:  Respiracin.   Pulso.   Latidos cardacos.   Temperatura.   Tensin muscular.   Actividad cerebrales.  La  biorretroalimentacin le ayudar a mejorar y Production manager sus ejercicios de Microbiologist. Aprender a Chief Operating Officer las respuestas fsicas relacionadas con el estrs. Una vez que se dominan las tcnicas no necesitar ms el equipamiento. P: Son hereditarios los dolores de [place]? R: Segn algunas estimaciones, aproximadamente 28 millones de estadounidenses sufren migraa. Cuatro de cada cinco (80%) informan una historia familiar de migraa. Los investigadores no pueden asegurar si se trata de un problema gentico o Patent examiner. A pesar de esto, un nio tiene 50% de probabilidades de sufrir migraa si uno de sus padres la sufre. El nio tiene un 75% de probabilidades si ambos padres la sufren.  P. Puede un nio tener migraa? R: En el momento de ingresar a la escuela secundaria, la mayora de los jvenes han experimentado algn tipo de cefalea. Algunos abordajes o medicamentos seguros y Sports administrator las cefaleas o detenerlas luego de que han comenzado.  Olin Hauser tipo de especialista debe ver para diagnosticar y tratar una cefalea? R: Comience con su mdico de cabecera. Huel Cote de su experiencia y abordaje de las cefaleas. Comente los mtodos de clasificacin, diagnstico y [place]. El profesional decidir si lo derivar a Music therapist, segn los sntomas u otras enfermedades. El hecho de  sufrir diabetes, alergias, etc, puede requerir un abordaje ms complejo. La National Headache Foundation (Fundacin Nacional para las [place]) Chief Technology Officer, a pedido, Physiological scientist lista de los mdicos que son miembros de [place]. Document Released: 01/20/2008 Document Revised: 01/26/2011 [facility] Patient Information 2012 [place], [place].Cefalea migraosa (Migraine Headache) Una cefalea migraosa es un dolor de cabeza intenso y punzante en uno de los dos lados de la [place]. No siempre se conoce la causa exacta. Una migraa puede producirse cuando los nervios del cerebro se irritant y liberan  qumicos que producen inflamacin dentro de los vasos sanguneos y Teaching laboratory technician. Muchas personas que padecen cefaleas tienen una historia familiar de migraas. Antes de sufrir una cefalea migraosa podr o no tener un aura. Un aura es un grupo de sntomas que predicen el comienzo de la cefalea. Esto puede incluir:  Cambios visuales como:   Flashes de Patent examiner.   Puntos brillantes o lneas en zig-zag.   Visin en tnel.   Sensacin de hormigueo.   Problemas para hablar.   Debilitamiento muscular.  SNTOMAS   Dolor en uno o ambos lados de la cabeza.   Dolor punzante o con pulsaciones.   Dolor que es lo suficientemente grave en intensidad como para impedir las actividades habituales.   Se agrava por cualquier actividad fsica habitual.   Nuseas (ganas de vomitar), vmitos o ambos.   Dolor ante la exposicin a luces brillantes o a ruidos fuertes o con Agricultural engineer.   Sensibilidad general a luces brillantes o a ruidos fuertes.  DISPARADORES DE LA CEFALEA MIGRAOSA Algunos factores que desencadenan la migraa son:   Neal Dy alcohol.   El hbito de fumar.   El estrs.   Pueden estar relacionadas con el perodo menstrual.   Quesos estacionados.   Alimentos o bebidas que contienen nitratos, glutamato, aspartamo o tiramina.   La falta de sueo.   Chocolate.   Cafena.   Hambre.   Medicamentos como nitroglicerina (se utiliza para tratar dolores en el pecho), pldoras anticonceptivas, estrgenos y algunos medicamentos para la presin sangunea.  DIAGNSTICO Una cefalea migraosa a menudo se diagnostica segn:  Sntomas.   Examen fsico.   Un estudio computarizado de rayos X (tomografa computarizada) de la cabeza.  TRATAMIENTO  Hay medicamentos que pueden prevenir estas cefaleas si son recurrentes o llegan a serlo en el futuro. El profesional que lo asiste lo ayudar a Horticulturist, commercial o el programa de tratamiento que puede ser de [place] para usted.   Acostarse en  una habitacin oscura y silenciosa puede ser til.   Llevar un registro de los dolores de cabeza puede ayudarlo a Building services engineer los desencadenantes del Engineer, mining.  SOLICITE ATENCIN MDICA DE INMEDIATO SI:   Confusin, cambios en la personalidad que no son habituales, temblores.   El dolor lo despierta por las noches.   Presenta un aumento en la frecuencia de las cefaleas.   Presenta rigidez en el cuello.   Sufre prdida de la visin.   Siente debilidad muscular.   Comienza a perder el equilibrio o tiene problemas para caminar.   Sufre mareos o se desmaya.  ASEGRESE QUE:   Comprende estas instrucciones.   Controlar su enfermedad.   Solicitar ayuda de inmediato si no mejora o empeora.  Document Released: 02/06/2005 Document Revised: 01/26/2011 [facility] Patient Information 2012 [place], [place].

## 2011-05-16 ENCOUNTER — Telehealth: Payer: Self-pay | Admitting: Pulmonary Disease

## 2011-05-16 NOTE — Telephone Encounter (Signed)
Spoke with pt's son. He states that he needs to have pt's ct chest rescheduled b/c she had endoscopic bravo placed today and he states they were told this would need to be in place for 3 days. They need to reschedule ct for after this is done. I have given him the number to call to reschedule this. Will forward to [facility] so he is aware.

## 2011-05-16 NOTE — Telephone Encounter (Signed)
Ok, I looked in his appt list and the ct was not rescheduled yet. Called son back to see why not and had to [facility]

## 2011-05-16 NOTE — Telephone Encounter (Signed)
Just as long as it gets done in reasonable time window.

## 2011-05-17 ENCOUNTER — Other Ambulatory Visit: Payer: Medicare Other

## 2011-05-22 ENCOUNTER — Ambulatory Visit (INDEPENDENT_AMBULATORY_CARE_PROVIDER_SITE_OTHER)
Admission: RE | Admit: 2011-05-22 | Discharge: 2011-05-22 | Disposition: A | Discharge status: Home / Self Care | Payer: Medicare Other | Source: Ambulatory Visit | Attending: Pulmonary Disease | Admitting: Pulmonary Disease

## 2011-05-22 DIAGNOSIS — R918 Other nonspecific abnormal finding of lung field: Secondary | ICD-10-CM

## 2011-05-22 DIAGNOSIS — R9389 Abnormal findings on diagnostic imaging of other specified body structures: Secondary | ICD-10-CM

## 2011-05-23 ENCOUNTER — Telehealth: Payer: Self-pay | Admitting: Pulmonary Disease

## 2011-05-23 MED ORDER — OMEPRAZOLE 20 MG PO CPDR: DELAYED_RELEASE_CAPSULE | ORAL | Status: DC

## 2011-05-23 NOTE — Telephone Encounter (Signed)
Fine with me

## 2011-05-23 NOTE — Telephone Encounter (Signed)
Spoke with pt and notified of rx change and she is okay with this. New rx was sent to pharm.

## 2011-05-23 NOTE — Telephone Encounter (Signed)
Member ID # [id number]. Per pharmacist with [facility], pt's insurance will cover the Omeprazole 20 mg, 2 tablets twice daily but not the 40mg  tablets. Dr. Shelle Iron, are you okay if we change this at the pt's pharmacy?

## 2011-05-25 ENCOUNTER — Other Ambulatory Visit: Payer: Self-pay | Admitting: Pulmonary Disease

## 2011-05-25 DIAGNOSIS — R918 Other nonspecific abnormal finding of lung field: Secondary | ICD-10-CM | POA: Insufficient documentation

## 2011-05-25 DIAGNOSIS — R911 Solitary pulmonary nodule: Secondary | ICD-10-CM

## 2011-05-31 ENCOUNTER — Ambulatory Visit: Payer: Medicare Other | Admitting: Pulmonary Disease

## 2011-08-28 ENCOUNTER — Other Ambulatory Visit: Payer: Medicare Other

## 2011-09-18 ENCOUNTER — Ambulatory Visit (INDEPENDENT_AMBULATORY_CARE_PROVIDER_SITE_OTHER)
Admission: RE | Admit: 2011-09-18 | Discharge: 2011-09-18 | Disposition: A | Discharge status: Home / Self Care | Payer: Medicare Other | Source: Ambulatory Visit | Attending: Pulmonary Disease | Admitting: Pulmonary Disease

## 2011-09-18 DIAGNOSIS — R911 Solitary pulmonary nodule: Secondary | ICD-10-CM

## 2012-03-15 DIAGNOSIS — H2510 Age-related nuclear cataract, unspecified eye: Secondary | ICD-10-CM | POA: Insufficient documentation

## 2012-03-15 DIAGNOSIS — H524 Presbyopia: Secondary | ICD-10-CM | POA: Insufficient documentation

## 2012-03-15 DIAGNOSIS — H35369 Drusen (degenerative) of macula, unspecified eye: Secondary | ICD-10-CM | POA: Insufficient documentation

## 2012-03-15 DIAGNOSIS — H04123 Dry eye syndrome of bilateral lacrimal glands: Secondary | ICD-10-CM | POA: Insufficient documentation

## 2012-05-04 ENCOUNTER — Observation Stay (HOSPITAL_COMMUNITY)
Admission: EM | Admit: 2012-05-04 | Discharge: 2012-05-05 | Disposition: A | Discharge status: Home / Self Care | Payer: Medicare Other | Attending: Internal Medicine | Admitting: Internal Medicine

## 2012-05-04 ENCOUNTER — Encounter (HOSPITAL_COMMUNITY): Payer: Self-pay | Admitting: Physical Medicine and Rehabilitation

## 2012-05-04 ENCOUNTER — Emergency Department (INDEPENDENT_AMBULATORY_CARE_PROVIDER_SITE_OTHER)
Admission: EM | Admit: 2012-05-04 | Discharge: 2012-05-04 | Disposition: A | Discharge status: Nursing Home (Includes ICF) | Payer: Medicare Other | Source: Home / Self Care | Attending: Emergency Medicine | Admitting: Emergency Medicine

## 2012-05-04 ENCOUNTER — Encounter (HOSPITAL_COMMUNITY): Payer: Self-pay | Admitting: Emergency Medicine

## 2012-05-04 ENCOUNTER — Emergency Department (HOSPITAL_COMMUNITY): Payer: Medicare Other

## 2012-05-04 DIAGNOSIS — K219 Gastro-esophageal reflux disease without esophagitis: Secondary | ICD-10-CM | POA: Diagnosis present

## 2012-05-04 DIAGNOSIS — R079 Chest pain, unspecified: Secondary | ICD-10-CM

## 2012-05-04 DIAGNOSIS — R51 Headache: Secondary | ICD-10-CM

## 2012-05-04 DIAGNOSIS — I10 Essential (primary) hypertension: Secondary | ICD-10-CM

## 2012-05-04 DIAGNOSIS — R519 Headache, unspecified: Secondary | ICD-10-CM | POA: Diagnosis present

## 2012-05-04 DIAGNOSIS — R5381 Other malaise: Secondary | ICD-10-CM | POA: Insufficient documentation

## 2012-05-04 DIAGNOSIS — F3289 Other specified depressive episodes: Secondary | ICD-10-CM | POA: Diagnosis present

## 2012-05-04 DIAGNOSIS — R911 Solitary pulmonary nodule: Secondary | ICD-10-CM | POA: Diagnosis present

## 2012-05-04 DIAGNOSIS — E782 Mixed hyperlipidemia: Secondary | ICD-10-CM

## 2012-05-04 DIAGNOSIS — R32 Unspecified urinary incontinence: Secondary | ICD-10-CM

## 2012-05-04 DIAGNOSIS — I200 Unstable angina: Secondary | ICD-10-CM

## 2012-05-04 DIAGNOSIS — R110 Nausea: Secondary | ICD-10-CM | POA: Insufficient documentation

## 2012-05-04 DIAGNOSIS — E039 Hypothyroidism, unspecified: Secondary | ICD-10-CM

## 2012-05-04 LAB — PROTIME-INR
INR: 1.02 (ref 0.00–1.49)
Prothrombin Time: 13.3 seconds (ref 11.6–15.2)

## 2012-05-04 LAB — CBC
Hemoglobin: 12.7 g/dL (ref 12.0–15.0)
MCH: 31 pg (ref 26.0–34.0)
MCV: 90.4 fL (ref 78.0–100.0)
Platelets: 279 10*3/uL (ref 150–400)
RBC: 4.15 MIL/uL (ref 3.87–5.11)
RBC: 4.1 MIL/uL (ref 3.87–5.11)
RDW: 13.2 % (ref 11.5–15.5)
WBC: 8.5 10*3/uL (ref 4.0–10.5)
WBC: 8.9 10*3/uL (ref 4.0–10.5)

## 2012-05-04 LAB — CREATININE, SERUM
Creatinine, Ser: 0.66 mg/dL (ref 0.50–1.10)
GFR calc Af Amer: >90 mL/min (ref >90)
GFR calc non Af Amer: 86 mL/min — ABNORMAL LOW (ref >90)

## 2012-05-04 LAB — BASIC METABOLIC PANEL
CO2: 22 mEq/L (ref 19–32)
Calcium: 9.9 mg/dL (ref 8.4–10.5)
Chloride: 96 mEq/L (ref 96–112)
Creatinine, Ser: 0.71 mg/dL (ref 0.50–1.10)
GFR calc Af Amer: >90 mL/min (ref >90)
Sodium: 133 mEq/L — ABNORMAL LOW (ref 135–145)

## 2012-05-04 MED ORDER — NITROGLYCERIN 0.4 MG SL SUBL: 0.4000 mg | SUBLINGUAL_TABLET | SUBLINGUAL | Status: DC | PRN

## 2012-05-04 MED ORDER — SODIUM CHLORIDE 0.9 % IJ SOLN: 3.0000 mL | Freq: Two times a day (BID) | INTRAMUSCULAR | Status: DC

## 2012-05-04 MED ORDER — SODIUM CHLORIDE 0.9 % IV SOLN
INTRAVENOUS | Status: DC
  Administered 2012-05-04: 12:00:00 via INTRAVENOUS

## 2012-05-04 MED ORDER — ONDANSETRON HCL 4 MG PO TABS: 4.0000 mg | ORAL_TABLET | Freq: Four times a day (QID) | ORAL | Status: DC | PRN

## 2012-05-04 MED ORDER — HYDROCHLOROTHIAZIDE 25 MG PO TABS
25.0000 mg | ORAL_TABLET | Freq: Every day | ORAL | Status: DC
  Administered 2012-05-04 – 2012-05-05 (×2): 25 mg via ORAL
  Filled 2012-05-04 (×2): qty 1

## 2012-05-04 MED ORDER — MORPHINE SULFATE 4 MG/ML IJ SOLN
4.0000 mg | Freq: Once | INTRAMUSCULAR | Status: AC
  Administered 2012-05-04: 4 mg via INTRAVENOUS
  Filled 2012-05-04: qty 1

## 2012-05-04 MED ORDER — SODIUM CHLORIDE 0.9 % IV SOLN: 250.0000 mL | INTRAVENOUS | Status: DC | PRN

## 2012-05-04 MED ORDER — OXYBUTYNIN CHLORIDE 5 MG PO TABS
5.0000 mg | ORAL_TABLET | Freq: Every day | ORAL | Status: DC
  Administered 2012-05-04: 5 mg via ORAL
  Filled 2012-05-04 (×2): qty 1

## 2012-05-04 MED ORDER — RIVASTIGMINE 9.5 MG/24HR TD PT24
9.5000 mg | MEDICATED_PATCH | Freq: Every day | TRANSDERMAL | Status: DC
  Administered 2012-05-04: 9.5 mg via TRANSDERMAL
  Filled 2012-05-04 (×2): qty 1

## 2012-05-04 MED ORDER — PANTOPRAZOLE SODIUM 40 MG PO TBEC
40.0000 mg | DELAYED_RELEASE_TABLET | Freq: Every day | ORAL | Status: DC
  Administered 2012-05-05: 40 mg via ORAL
  Filled 2012-05-04 (×2): qty 1

## 2012-05-04 MED ORDER — ACETAMINOPHEN 325 MG PO TABS
650.0000 mg | ORAL_TABLET | Freq: Four times a day (QID) | ORAL | Status: DC | PRN
  Administered 2012-05-05: 650 mg via ORAL
  Filled 2012-05-04: qty 2

## 2012-05-04 MED ORDER — SODIUM CHLORIDE 0.9 % IJ SOLN
3.0000 mL | Freq: Two times a day (BID) | INTRAMUSCULAR | Status: DC
  Administered 2012-05-04: 3 mL via INTRAVENOUS

## 2012-05-04 MED ORDER — NITROGLYCERIN 0.4 MG SL SUBL
SUBLINGUAL_TABLET | SUBLINGUAL | Status: AC
  Filled 2012-05-04: qty 25

## 2012-05-04 MED ORDER — SODIUM CHLORIDE 0.9 % IJ SOLN: 3.0000 mL | INTRAMUSCULAR | Status: DC | PRN

## 2012-05-04 MED ORDER — ATORVASTATIN CALCIUM 20 MG PO TABS
20.0000 mg | ORAL_TABLET | Freq: Every day | ORAL | Status: DC
  Administered 2012-05-04 – 2012-05-05 (×2): 20 mg via ORAL
  Filled 2012-05-04 (×2): qty 1

## 2012-05-04 MED ORDER — ASPIRIN 81 MG PO CHEW
324.0000 mg | CHEWABLE_TABLET | Freq: Once | ORAL | Status: AC
  Administered 2012-05-04: 324 mg via ORAL

## 2012-05-04 MED ORDER — LISINOPRIL 5 MG PO TABS
5.0000 mg | ORAL_TABLET | Freq: Every day | ORAL | Status: DC
  Administered 2012-05-05: 5 mg via ORAL
  Filled 2012-05-04 (×2): qty 1

## 2012-05-04 MED ORDER — ASPIRIN EC 81 MG PO TBEC
81.0000 mg | DELAYED_RELEASE_TABLET | Freq: Every day | ORAL | Status: DC
  Administered 2012-05-05: 81 mg via ORAL
  Filled 2012-05-04 (×2): qty 1

## 2012-05-04 MED ORDER — MORPHINE SULFATE 2 MG/ML IJ SOLN: 2.0000 mg | INTRAMUSCULAR | Status: DC | PRN

## 2012-05-04 MED ORDER — HEPARIN SODIUM (PORCINE) 5000 UNIT/ML IJ SOLN
5000.0000 [IU] | Freq: Three times a day (TID) | INTRAMUSCULAR | Status: DC
  Administered 2012-05-04 – 2012-05-05 (×2): 5000 [IU] via SUBCUTANEOUS
  Filled 2012-05-04 (×5): qty 1

## 2012-05-04 MED ORDER — VITAMIN D (ERGOCALCIFEROL) 1.25 MG (50000 UNIT) PO CAPS: 50000.0000 [IU] | ORAL_CAPSULE | ORAL | Status: DC

## 2012-05-04 MED ORDER — FLUOXETINE HCL 20 MG PO CAPS
40.0000 mg | ORAL_CAPSULE | Freq: Every day | ORAL | Status: DC
  Administered 2012-05-04 – 2012-05-05 (×2): 40 mg via ORAL
  Filled 2012-05-04 (×2): qty 2

## 2012-05-04 MED ORDER — NITROGLYCERIN 0.4 MG SL SUBL
0.4000 mg | SUBLINGUAL_TABLET | SUBLINGUAL | Status: AC | PRN
  Administered 2012-05-04: 0.4 mg via SUBLINGUAL

## 2012-05-04 MED ORDER — TRAZODONE HCL 50 MG PO TABS
50.0000 mg | ORAL_TABLET | Freq: Every day | ORAL | Status: DC
  Filled 2012-05-04 (×2): qty 1

## 2012-05-04 MED ORDER — ASPIRIN 81 MG PO CHEW
CHEWABLE_TABLET | ORAL | Status: AC
  Filled 2012-05-04: qty 4

## 2012-05-04 MED ORDER — ACETAMINOPHEN 650 MG RE SUPP: 650.0000 mg | Freq: Four times a day (QID) | RECTAL | Status: DC | PRN

## 2012-05-04 NOTE — ED Provider Notes (Signed)
Medical screening examination/treatment/procedure(s) were conducted as a shared visit with non-physician practitioner(s) and myself.  I personally evaluated the patient during the encounter  Pt with no known history of CAD.  Presents with chest pain.  Initial workup unremarkable but no source for her chest pain and symptoms concerning for possible acs.  Plan on admission for further evaluation  Celene Kras, MD 05/04/12 [phone]

## 2012-05-04 NOTE — ED Provider Notes (Signed)
Chief Complaint:   Chief Complaint  Patient presents with  . Chest Pain    History of Present Illness:   Heidi Preston is a 72 year old female with hypertension and hypercholesterolemia who this morning experienced an episode of sharp, right pectoral chest pain with radiation into her right jaw lasting about 10 minutes. This was associated with nausea but no diaphoresis or shortness of breath. The pain is gone away completely right now, but she does have some heaviness and discomfort in both arms. She's never had anything like this before, no history of exertional chest pain, no history of cardiac disease. She denies fever, chills, coughing, wheezing, shortness of breath, palpitations, dizziness, syncope, abdominal pain, nausea, or vomiting.  Review of Systems:  Other than noted above, the patient denies any of the following symptoms. Systemic:  No fever, chills, sweats, or fatigue. ENT:  No nasal congestion, rhinorrhea, or sore throat. Pulmonary:  No cough, wheezing, shortness of breath, sputum production, hemoptysis. Cardiac:  No palpitations, rapid heartbeat, dizziness, presyncope or syncope. GI:  No abdominal pain, heartburn, nausea, or vomiting. Ext:  No leg pain or swelling.  PMFSH:  Past medical history, family history, social history, meds, and allergies were reviewed and updated as needed. She has no medication allergies, does have a history of anxiety and fibromyalgia. She is on numerous medications.  Physical Exam:   Vital signs:  BP 163/83  Pulse 67  Temp(Src) 98.2 F (36.8 C) (Oral)  Resp 18  SpO2 98% Gen:  Alert, oriented, in no distress, skin warm and dry. She appears upset and tearful. Eye:  PERRL, lids and conjunctivas normal.  Sclera non-icteric. ENT:  Mucous membranes moist, pharynx clear. Neck:  Supple, no adenopathy or tenderness.  No JVD. Lungs:  Clear to auscultation, no wheezes, rales or rhonchi.  No respiratory distress. Heart:  Regular rhythm.  No gallops,  murmers, clicks or rubs. Chest:  No chest wall tenderness. Abdomen:  Soft, nontender, no organomegaly or mass.  Bowel sounds normal.  No pulsatile abdominal mass or bruit. Ext:  No edema.  No calf tenderness and Homann's sign negative.  Pulses full and equal. Skin:  Warm and dry.  No rash.   EKG:   Date: 05/04/2012  Rate: 62  Rhythm: normal sinus rhythm  QRS Axis: normal  Intervals: normal  ST/T Wave abnormalities: nonspecific T wave changes  Conduction Disutrbances:none  Narrative Interpretation: Sinus rhythm, nonspecific T wave abnormalities.  Old EKG Reviewed: unchanged   Medications given in UCC:  Given aspirin 325 mg by mouth and nitroglycerin 0.4 mg sublingually. IV normal saline started at 50 mL per hour.  Assessment:  The encounter diagnosis was Chest pain.  Episode is concerning for unstable angina.   Plan:   1.  The following meds were prescribed:   New Prescriptions   No medications on file   2.  The patient was transferred to the emergency department in stable condition via [facility].  Reuben Likes, MD 05/04/12 [phone]

## 2012-05-04 NOTE — H&P (Signed)
Hospital Admission Note Date: 05/04/2012  Patient name: Heidi Preston Medical record number: [id number] Date of birth: 1940-04-30 Age: 72 y.o. Gender: female PCP: [name], NP  Medical Service: Internal medicine teaching service.   Attending physician: Dr Dalphine Handing   1st Contact: Dr Zada Girt   Pager: [phone]  2nd Contact: Dr Clyde Lundborg    Pager: [phone]  After 5 pm or weekends: 1st Contact:      Pager: [phone] 2nd Contact:      Pager: [phone]  Chief Complaint: Chest pressure, and dizziness.  History of Present Illness: Heidi Preston is a 72 year old, Spanish-speaking woman, with past medical history of hypertension, hypothyroidism, mild dementia, and chronic headaches, who presents to the ED, with an episode of chest pressure, and dizziness. Her daughter was at bedside assisted in translation.   The episode happened at around 9 AM on the morning of admission while she was riding in a car with her husband. She suddenly started experiencing dizziness and chest pressure on the right side of her chest. She is not sure she had any chest pain. She denies it radiating to the left jaw or left upper extremity or left shoulder. However, it radiated to the right side of upper arm and the right side of her face. She denies feeling shortness of breath or palpitations or diaphoresis. However, she felt lightheaded, and the sensation of floating in the air. She reported history of nausea, but no vomiting. The symptoms were intense for about 15 minutes, and gradually subsided over the course of about 1 hour. Her husband immediately drove her to the ED. At 4 PM when we evaluated her for admission, the symptoms had completely resolved and she was feeling normal. She and her husband had had a good breakfast prior to the car ride. She denies history of falls. She denies vertigo, auditory, visual changes, associated with the symptoms.  She denied headaches even though she has a history of frequently headaches. She  has never experienced such chest pain/pressure or discomfort. She denies any symptoms of weakness or loss of sensation or sensation of her upper extremity feeling heavy during the attack of the symptoms. At baseline, Heidi Preston is an active woman, regularly exercising with her husband by walking in the park and she denies ever experiencing chest pain. She does not have a history of PND, swelling of her legs, or exertional dyspnea. She does not have orthopnea. She denies any weakness in her lower extremities.  She has no family history of heart disease, MI or stroke. She does not smoke, and she does not use aspirin.    Meds: Prior to Admission medications   Medication Sig Start Date End Date Taking? Authorizing Provider  atorvastatin (LIPITOR) 20 MG tablet Take 20 mg by mouth daily.   Yes Historical Provider, MD  esomeprazole (NEXIUM) 40 MG capsule Take 40 mg by mouth daily before breakfast.   Yes Historical Provider, MD  FLUoxetine (PROZAC) 40 MG capsule Take 40 mg by mouth daily.   Yes Historical Provider, MD  hydrochlorothiazide (HYDRODIURIL) 25 MG tablet Take 25 mg by mouth daily.   Yes Historical Provider, MD  oxybutynin (DITROPAN) 5 MG tablet Take 5 mg by mouth daily.   Yes Historical Provider, MD  rivastigmine (EXELON) 9.5 mg/24hr Place 1 patch onto the skin at bedtime.    Yes Historical Provider, MD  traZODone (DESYREL) 50 MG tablet Take 50 mg by mouth at bedtime.   Yes Historical Provider, MD  Vitamin D, Ergocalciferol, (DRISDOL) 50000 UNITS  CAPS Take 50,000 Units by mouth every 7 (seven) days. Takes on Mon   Yes Historical Provider, MD    Allergies: Allergies as of 05/04/2012  . (No Known Allergies)   Past Medical History  Diagnosis Date  . Anxiety   . Arthritis   . Hypertension   . Blepharitis, both eyes   . Esophagitis, reflux   . Hypercholesteremia   . Fatigue   . Hypothyroid    Past Surgical History  Procedure Laterality Date  . Total abdominal hysterectomy    .  Cholecystectomy    . Shoulder surgery     No family history on file. History   Social History  . Marital Status: Married    Spouse Name: N/A    Number of Children: Y  . Years of Education: N/A   Occupational History  . housewife    Social History Main Topics  . Smoking status: Never Smoker   . Smokeless tobacco: Not on file  . Alcohol Use: No  . Drug Use: No  . Sexually Active: No   Other Topics Concern  . Not on file   Social History Narrative  . No narrative on file    Review of Systems: Constitutional: negative for anorexia, chills, fatigue, fevers, malaise and sweats Eyes: negative for cataracts, color blindness, irritation, redness and visual disturbance Respiratory: negative for asthma, chronic bronchitis, cough, dyspnea on exertion, pleurisy/chest pain, pneumonia, sputum and wheezing Gastrointestinal: positive for nausea, negative for change in bowel habits, constipation, diarrhea and vomiting Genitourinary:negative Musculoskeletal:negative  Physical Exam: Blood pressure 167/55, pulse 59, temperature 97.9 F (36.6 C), temperature source Oral, resp. rate 19, SpO2 96.00%. General appearance: alert, cooperative, appears stated age, no distress, moderate distress and Husband and daughter at bedside. Head: Normocephalic, without obvious abnormality, atraumatic Eyes: conjunctivae/corneas clear. PERRL, EOM's intact. Fundi benign. Ears: normal TM's and external ear canals both ears Neck: no adenopathy, no carotid bruit, no JVD, supple, symmetrical, trachea midline and thyroid not enlarged, symmetric, no tenderness/mass/nodules Back: symmetric, no curvature. ROM normal. No CVA tenderness. Lungs: clear to auscultation bilaterally Heart: regular rate and rhythm, S1, S2 normal, no murmur, click, rub or gallop Abdomen: soft, non-tender; bowel sounds normal; no masses,  no organomegaly Extremities: extremities normal, atraumatic, no cyanosis or edema Pulses: 2+ and  symmetric Lymph nodes: Cervical, supraclavicular, and axillary nodes normal. Neurologic: Alert and oriented X 3, normal strength and tone. Normal symmetric reflexes. Normal coordination and gait Mental status: Alert, oriented, thought content appropriate, appearing a little anxious  Cranial nerves: normal Sensory: normal Motor: grossly normal Reflexes: 2+ and symmetric Coordination: Coordination with finger-to-nose normal. Romberg sign negative. Gait: Patient is able to get on her feet from the bed without any assistance and she walked a short distance without feeling dizziness. The gait was normal.  Lab results: Basic Metabolic Panel:  Recent Labs  45/40/98 1250  NA 133*  K 4.8  CL 96  CO2 22  GLUCOSE 83  BUN 16  CREATININE 0.71  CALCIUM 9.9   CBC:  Recent Labs  05/04/12 1250  WBC 8.5  HGB 13.4  HCT 37.5  MCV 90.4  PLT 279   Misc. Labs:   Imaging results:  Dg Chest 2 View  05/04/2012  *RADIOLOGY REPORT*  Clinical Data: Chest pain.  CHEST - 2 VIEW  Comparison: 05/10/2011  Findings: The cardiomediastinal silhouette is unremarkable. Pleural fat at the left costophrenic junction again noted. There is no evidence of focal airspace disease, pulmonary edema, suspicious pulmonary nodule/mass, pleural  effusion, or pneumothorax. No acute bony abnormalities are identified. Remote left rib fractures are present.  IMPRESSION: No evidence of active cardiopulmonary disease.   Original Report Authenticated By: Harmon Pier, M.D.     Other results: EKG: normal sinus rhythm, borderline left axis deviation,  T wave inversions in the precordial leads including V4, V5, and V6, and T wave flattening in the inferior leads lead 3 and aVF. Similar changes are noted from her the previous EKG of 27th of August 2012.   Assessment & Plan by Problem: Patient is 72 yo Spanish-speaking female with PMH of HTN, DM-II (A1c 6.0 on 2009, not on med), HLD (LDL 132 on 2011), hypothyroidism (not on  medication), who was admitted for atypical chest pain. EKG has some nonspecific T changes which has no significant change compared with previous one. Patient is bradycardia. CXR negative. WBC=8.5.   Chest pain: Patient presents with atypical chest pain with some nonspecific T wave changes in inferior and precordial leads -unchanged from previous. TIMI risk score is 1; which predicts a 14 days risk of death, recurrent MI, or urgent revascularization of 4.5%. Regardless with need to rule out ACS by overnight observation.  Other DD include, but less likely, aortic dissection (no typical tearing chest pain, chest x-ray has no mediastinal widening), PE (chest is not pleuratic, patient does not have tachycardia, oxygen saturation is 99% at room air, Well's score is low probability), pneumothorax (negative chest x-ray), esophageal perforation (no recent history of endoscopy) , pneumonia (no leukocytosis, fever, and a negative chest x-ray), GERD  on Nexium at home) and anxiety. Plan -will admit to Tele bed for observation as chest pain rule out. - cycle CE q6 x3 and repeat her EKG in the am - Nitroglycerin, Morphine, and aspirin, lipitor - Risk factor stratification: will check TSH, FLP and A1C - Consider cardiology consult if test positive for CEs - AM EKG at 5 am   Hypertension: Blood pressure is 143/72-167/50 mmHg on admission. Patient is on HCTZ at home. - Willcontinue HCTZ - Will add low-dose lisinopril 5 mg daily. - will avoid dromotropic antihypertensives due to her heart rates which are low at 54-67  HLD: LDL was 132 on 06/07/09. She is on Lipitor at home.  -Will continue Lipitor and check lipid profile.  Depression: stable. On Prozac and trazodone.  Urinary incontinence: It is stable. She is taking oxybutynin at home. Will continue.   GERD: stable. Will continue PPI  Hypothyroidism: Patient carries diagnosis of hypothyroidism. She is not taking any medications at home. Her TSH was normal on  06/07/09. -Will check TSH  DVT PPx: SQ heparin    Dispo: Disposition is deferred at this time, awaiting improvement of current medical problems. Anticipated discharge in approximately 1 day(s).   The patient does have a current PCP (MARTIN,NYKEDTRA, NP), therefore is not requiring [facility]PC follow-up after discharge.   The patient does not have transportation limitations that hinder transportation to clinic appointments.  Signed:  Dow Adolph  PGY-1 Internal Medicine Teaching Service  Pager: [phone] 05/04/2012 6:10 PM

## 2012-05-04 NOTE — ED Notes (Signed)
Pt is here for chest pain onset 0930 Reports being a sharp pain that lasted for 10 minutes but the pain radiated to the right arm and still lingering Pain at the moment is 8/10 Sx also include: headache, dizziness, nauseas Denies: SOB States she feels a little better but just shaken by the sudden chest pain Hx of fibromyalgia Took 800mg  of ibuprofen  She is alert and oriented w/no signs of acute distress.

## 2012-05-04 NOTE — ED Notes (Signed)
[facility] has been called and given report

## 2012-05-04 NOTE — ED Notes (Signed)
Pt presents to department from [facility] via [facility] for evaluation of diffuse midsternal chest pain that progressed to bilateral shoulders and elbows. Onset this morning @ 9:00am. Denies pain at the time. Pt is alert and oriented x4. Does not speak english, family at bedside to translate. Respirations unlabored.

## 2012-05-04 NOTE — ED Provider Notes (Signed)
History     CSN: [id number]  Arrival date & time 05/04/12  1227   First MD Initiated Contact with Patient 05/04/12 1229      Chief Complaint  Patient presents with  . Chest Pain    (Consider location/radiation/quality/duration/timing/severity/associated sxs/prior treatment) HPI Comments: 72 year old Spanish-speaking female with a history of hypertension and hyperlipidemia presents emergency department from urgent care with her husband and her son who is translating for her for further evaluation of chest pain. Patient states chest pain began suddenly about 3 hours prior to arrival located on the right side of her chest described as heavy pressure radiating to her right shoulder and jaw. Episode lasted about 10 minutes, resolved when she was at urgent care, however returned en route to ED. States she feels heaviness in both of her arms. Admits to associated nausea without vomiting. Denies ever having chest pain like this in the past. Nothing in specific makes the pain worse. She was given 325 mg aspirin along with sublingual nitroglycerin at urgent care without any change. Pain has been coming and going at random since. Denies associated shortness of breath or cough. No recent illness.  Patient is a 72 y.o. female presenting with chest pain. The history is provided by the patient and a relative. A language interpreter was used (son, spanish).  Chest Pain Associated symptoms: nausea and weakness   Associated symptoms: no abdominal pain, no back pain, no cough, no diaphoresis, no dizziness, no fever, no shortness of breath and not vomiting     Past Medical History  Diagnosis Date  . Anxiety   . Arthritis   . Hypertension   . Blepharitis, both eyes   . Esophagitis, reflux   . Hypercholesteremia   . Fatigue   . Hypothyroid     Past Surgical History  Procedure Laterality Date  . Total abdominal hysterectomy    . Cholecystectomy    . Shoulder surgery      No family history on  file.  History  Substance Use Topics  . Smoking status: Never Smoker   . Smokeless tobacco: Not on file  . Alcohol Use: No    OB History   Grav Para Term Preterm Abortions TAB SAB Ect Mult Living                  Review of Systems  Constitutional: Negative for fever, chills and diaphoresis.  Eyes: Negative for visual disturbance.  Respiratory: Negative for cough and shortness of breath.   Cardiovascular: Positive for chest pain.  Gastrointestinal: Positive for nausea. Negative for vomiting and abdominal pain.  Musculoskeletal: Negative for back pain.  Neurological: Positive for weakness. Negative for dizziness and light-headedness.  All other systems reviewed and are negative.    Allergies  Review of patient's allergies indicates no known allergies.  Home Medications   Current Outpatient Rx  Name  Route  Sig  Dispense  Refill  . atorvastatin (LIPITOR) 20 MG tablet   Oral   Take 20 mg by mouth daily.         . celecoxib (CELEBREX) 200 MG capsule   Oral   Take 200 mg by mouth 2 (two) times daily.         Marland Kitchen donepezil (ARICEPT) 5 MG tablet   Oral   Take 5 mg by mouth at bedtime as needed.         Marland Kitchen escitalopram (LEXAPRO) 10 MG tablet   Oral   Take 10 mg by mouth daily.         Marland Kitchen  esomeprazole (NEXIUM) 40 MG capsule   Oral   Take 40 mg by mouth daily before breakfast.         . hydrochlorothiazide (HYDRODIURIL) 25 MG tablet   Oral   Take 25 mg by mouth daily.         Marland Kitchen ibuprofen (ADVIL,MOTRIN) 200 MG tablet   Oral   Take 400 mg by mouth every 6 (six) hours as needed.         . memantine (NAMENDA TITRATION PACK) tablet pack   Oral   Take by mouth See admin instructions. 5 mg/day for =1 week; 5 mg twice daily for =1 week; 15 mg/day given in 5 mg and 10 mg separated doses for =1 week; then 10 mg twice daily         . memantine (NAMENDA) 10 MG tablet   Oral   Take 10 mg by mouth 2 (two) times daily.         Marland Kitchen omeprazole (PRILOSEC) 20 MG  capsule      Take 2 tablets twice daily   120 capsule   5   . rivastigmine (EXELON) 4.6 mg/24hr   Transdermal   Place 1 patch onto the skin daily.         . rivastigmine (EXELON) 9.5 mg/24hr   Transdermal   Place 1 patch onto the skin daily.         . traZODone (DESYREL) 50 MG tablet   Oral   Take 50 mg by mouth at bedtime.           There were no vitals taken for this visit.  Physical Exam  Nursing note and vitals reviewed. Constitutional: She is oriented to person, place, and time. She appears well-developed and well-nourished. She appears distressed.  HENT:  Head: Normocephalic and atraumatic.  Mouth/Throat: Oropharynx is clear and moist.  Eyes: Conjunctivae and EOM are normal. Pupils are equal, round, and reactive to light.  Neck: Normal range of motion. Neck supple. No JVD present. No tracheal deviation present.  Cardiovascular: Regular rhythm and intact distal pulses.  Tachycardia present.   No extremity edema.  Pulmonary/Chest: Effort normal and breath sounds normal. No respiratory distress. She has no decreased breath sounds. She has no wheezes. She has no rhonchi. She has no rales. She exhibits no tenderness.  Abdominal: Soft. Bowel sounds are normal. She exhibits no distension and no mass. There is no tenderness.  Musculoskeletal: Normal range of motion. She exhibits no edema.  Neurological: She is alert and oriented to person, place, and time.  Skin: Skin is warm and dry. She is not diaphoretic.  Psychiatric: Her speech is normal and behavior is normal. Her mood appears anxious.    ED Course  Procedures (including critical care time)  Labs Reviewed  BASIC METABOLIC PANEL - Abnormal; Notable for the following:    Sodium 133 (*)    GFR calc non Af Amer 84 (*)    All other components within normal limits  CBC  troponin not crossing over in computer system- result 0.02.  Date: 05/04/2012  Rate: 66  Rhythm: normal sinus rhythm  QRS Axis: normal   Intervals: normal  ST/T Wave abnormalities: nonspecific ST changes borderline in inferior leads  Conduction Disutrbances:none  Narrative Interpretation: no stemi  Old EKG Reviewed: unchanged   Dg Chest 2 View  05/04/2012  *RADIOLOGY REPORT*  Clinical Data: Chest pain.  CHEST - 2 VIEW  Comparison: 05/10/2011  Findings: The cardiomediastinal silhouette is unremarkable. Pleural fat at  the left costophrenic junction again noted. There is no evidence of focal airspace disease, pulmonary edema, suspicious pulmonary nodule/mass, pleural effusion, or pneumothorax. No acute bony abnormalities are identified. Remote left rib fractures are present.  IMPRESSION: No evidence of active cardiopulmonary disease.   Original Report Authenticated By: Harmon Pier, M.D.      1. Unstable angina   2. Chest pain       MDM  72 y/o female with unstable angina. Pain free after receiving morphine. Labs unremarkable. Initial troponin negative. No EKG changes. CXR unremarkable. No prior cardiac workup. Physical exam unremarkable. I feel admission is appropriate for further workup. I spoke with Dr. Clyde Lundborg with internal medicine teaching service who will accept patient for admission. Case discussed with Dr. Lynelle Doctor who agrees with plan of care.        Trevor Mace, PA-C 05/04/12 [phone]

## 2012-05-05 LAB — BASIC METABOLIC PANEL
Calcium: 9.6 mg/dL (ref 8.4–10.5)
Chloride: 97 mEq/L (ref 96–112)
Creatinine, Ser: 0.71 mg/dL (ref 0.50–1.10)
GFR calc Af Amer: >90 mL/min (ref >90)
GFR calc non Af Amer: 84 mL/min — ABNORMAL LOW (ref >90)

## 2012-05-05 LAB — T4, FREE: Free T4: 1.11 ng/dL (ref 0.80–1.80)

## 2012-05-05 LAB — HEMOGLOBIN A1C: Hgb A1c MFr Bld: 6.1 % — ABNORMAL HIGH (ref <5.7)

## 2012-05-05 LAB — TROPONIN I: Troponin I: <0.3 ng/mL (ref <0.30)

## 2012-05-05 LAB — LIPID PANEL: Cholesterol: 198 mg/dL (ref 0–200)

## 2012-05-05 MED ORDER — LISINOPRIL 5 MG PO TABS: 5.0000 mg | ORAL_TABLET | Freq: Every day | ORAL | Status: DC

## 2012-05-05 MED ORDER — ASPIRIN 81 MG PO TBEC: 81.0000 mg | DELAYED_RELEASE_TABLET | Freq: Every day | ORAL | Status: DC

## 2012-05-05 MED ORDER — IBUPROFEN 800 MG PO TABS: 800.0000 mg | ORAL_TABLET | ORAL | Status: DC | PRN

## 2012-05-05 MED ORDER — ATORVASTATIN CALCIUM 20 MG PO TABS: 20.0000 mg | ORAL_TABLET | Freq: Every day | ORAL | Status: DC

## 2012-05-05 MED ORDER — ATORVASTATIN CALCIUM 20 MG PO TABS: 40.0000 mg | ORAL_TABLET | Freq: Every day | ORAL | Status: DC

## 2012-05-05 MED ORDER — IBUPROFEN 800 MG PO TABS: 800.0000 mg | ORAL_TABLET | Freq: Three times a day (TID) | ORAL | Status: DC | PRN

## 2012-05-05 MED ORDER — NITROGLYCERIN 0.4 MG SL SUBL: 0.4000 mg | SUBLINGUAL_TABLET | SUBLINGUAL | Status: DC | PRN

## 2012-05-05 NOTE — Progress Notes (Signed)
Subjective: No overnight events. The patient denies chest pain or dizziness. No shortness of breath. Objective: Vital signs in last 24 hours: Filed Vitals:   05/04/12 1822 05/04/12 2003 05/04/12 2341 05/05/12 0400  BP: 161/65 133/71 118/63 148/96  Pulse: 58 63 73 77  Temp: 98.3 F (36.8 C) 97.6 F (36.4 C) 98.1 F (36.7 C) 98.8 F (37.1 C)  TempSrc: Oral Oral Oral   Resp: 20 18 18 16   Height: 5\' 4"  (1.626 m)     Weight: 167 lb 12.3 oz (76.1 kg)   168 lb 1.6 oz (76.25 kg)  SpO2: 96% 95% 95% 94%   Weight change:   Intake/Output Summary (Last 24 hours) at 05/05/12 1019 Last data filed at 05/05/12 0500  Gross per 24 hour  Intake      0 ml  Output    250 ml  Net   -250 ml   Physical examination: General appearance: alert, cooperative, appears stated age, no distress, moderate distress and Husband and daughter at bedside. patient in the bed comfortably. Lungs: clear to auscultation bilaterally  Heart: regular rate and rhythm, S1, S2 normal, no murmur, click, rub or gallop  Abdomen: soft, non-tender; bowel sounds normal; no masses, no organomegaly  Extremities: extremities normal, atraumatic, no cyanosis or edema  Pulses: 2+ and symmetric  Neurologic: Alert and oriented X 3, normal strength and tone.  Lab Results: Basic Metabolic Panel:  Recent Labs Lab 05/04/12 1250 05/04/12 1901 05/05/12 0510  NA 133*  --  133*  K 4.8  --  4.1  CL 96  --  97  CO2 22  --  22  GLUCOSE 83  --  100*  BUN 16  --  18  CREATININE 0.71 0.66 0.71  CALCIUM 9.9  --  9.6   CBC:  Recent Labs Lab 05/04/12 1250 05/04/12 1901  WBC 8.5 8.9  HGB 13.4 12.7  HCT 37.5 36.3  MCV 90.4 88.5  PLT 279 277   Cardiac Enzymes:  Recent Labs Lab 05/04/12 1901 05/05/12 0011 05/05/12 0510  TROPONINI <0.30 <0.30 <0.30   Hemoglobin A1C:  Recent Labs Lab 05/04/12 1901  HGBA1C 6.1*   Fasting Lipid Panel:  Recent Labs Lab 05/05/12 0510  CHOL 198  HDL 43  LDLCALC 127*  TRIG 140  CHOLHDL  4.6   Thyroid Function Tests:  Recent Labs Lab 05/04/12 1901  TSH 6.138*   Coagulation:  Recent Labs Lab 05/04/12 1901  LABPROT 13.3  INR 1.02   Misc. Labs:  Morning EKG unchanged.  Studies/Results: Dg Chest 2 View  05/04/2012  *RADIOLOGY REPORT*  Clinical Data: Chest pain.  CHEST - 2 VIEW  Comparison: 05/10/2011  Findings: The cardiomediastinal silhouette is unremarkable. Pleural fat at the left costophrenic junction again noted. There is no evidence of focal airspace disease, pulmonary edema, suspicious pulmonary nodule/mass, pleural effusion, or pneumothorax. No acute bony abnormalities are identified. Remote left rib fractures are present.  IMPRESSION: No evidence of active cardiopulmonary disease.   Original Report Authenticated By: Harmon Pier, M.D.    Medications: I have reviewed the patient's current medications. Scheduled Meds: . aspirin EC  81 mg Oral Daily  . atorvastatin  20 mg Oral Daily  . FLUoxetine  40 mg Oral Daily  . heparin  5,000 Units Subcutaneous Q8H  . hydrochlorothiazide  25 mg Oral Daily  . lisinopril  5 mg Oral Daily  . oxybutynin  5 mg Oral Daily  . pantoprazole  40 mg Oral Daily  . rivastigmine  9.5 mg Transdermal QHS  . sodium chloride  3 mL Intravenous Q12H  . sodium chloride  3 mL Intravenous Q12H  . traZODone  50 mg Oral QHS  . [START ON 05/06/2012] Vitamin D (Ergocalciferol)  50,000 Units Oral Q Mon   Continuous Infusions:  PRN Meds:.sodium chloride, acetaminophen, acetaminophen, morphine injection, nitroGLYCERIN, ondansetron, sodium chloride Assessment/Plan: Patient is 72 yo Spanish-speaking female with PMH of HTN, DM-II (A1c 6.0 on 2009, not on med), HLD (LDL 132 on 2011), hypothyroidism (not on medication), who was admitted for atypical chest pain. EKG has some nonspecific T changes which has no significant change compared with previous one. Patient is bradycardia. CXR negative. WBC=8.5.   Chest pain: Etiology unclear. The chest pain,  sounded atypical. TIMI score of 1 with her only risk factors being hypertension, and age. Other causes like pulmonary embolism were not clinically suspected. Troponins are negative times 3 in the morning.. EKG revealed old T wave inversions in the precordial leads and flattening of T waves in the inferior leads. After one night of observation, she remained asymptomatic. If she develops more episodes of chest pains, she might need evaluation with the exercise stress test as outpatient.  Plan  -Discharge today  Hypertension: Patient  takes HCTZ, 25 mg once daily as her home regimen. Her blood pressure remained slightly elevated during hospital stay in the ranges of 140s to 160s over 80s. Lisinopril was added to her regimen. She'll be discharged on lisinopril to follow up as outpatient with her PCP. Plan - Willcontinue HCTZ  -  lisinopril 5 mg daily.  - will avoid dromotropic antihypertensives due to her heart rates which are low at 54-67   Hyperlipidemia : LDL was 132 on 06/07/09. She is on Lipitor 20 mg daily at home.  repeat lipid panel revealed total cholesterol of 198, HDL 43, and LDL 127. Her 10 year risk for ASCVD was calculated is 20.5%, and the recommendation is to aim at moderate to high intensity statin therapy. She will continue with her Lipitor at the increased dose of 40 mg once daily. She will followup as outpatient.  Hypothyroidism: Patient carries diagnosis of hypothyroidism. She is not taking any medications at home. Her TSH was normal on 06/07/09.  A repeat check TSH was slightly elevated at 6.13. Free T4 was ordered. She'll need outpatient repeat check with her PCP to determine if she needs treatment for hyperthyroidism.    Depression: Symptoms are stable. She will continue with her current regimen of Prozac and trazodone.    Urinary incontinence: It is stable. She is taking oxybutynin at home. Will continue.   GERD: stable. Will continue PPI    DVT PPx: SQ heparin  Dispo:  Disposition is deferred at this time, awaiting improvement of current medical problems. Anticipated discharge in approximately 1 day(s).  The patient does have a current PCP (MARTIN,NYKEDTRA, NP), therefore is not requiring OPC follow-up after discharge.  The patient does not have transportation limitations that hinder transportation to clinic appointments.       LOS: 1 day   Dow Adolph PGY1 - Internal Medicine Teaching Service Pager: [phone] 05/05/2012, 10:19 AM

## 2012-05-05 NOTE — H&P (Addendum)
Internal Medicine Teaching Service Attending Note Date: 05/05/2012  Patient name: Heidi Preston  Medical record number: [id number]  Date of birth: 04/02/1940   Brief History: The patient, Heidi Preston, is a 72 y.o. year old female, with past medical history of hypertension, dyslipidemia, hypothyroidism (mentioned in history but the patient is not on any medications), who comes in with the chief  complaint of chest pressure and dizziness. I have read the history documented by Dr.Kazibwe and I concur with the chronology of events.   When I met with the patient today, her daughter and her husband was at bedside. The patient is spanish speaking so the daughter translated for [place]. The patient felt chest pressure for the first time in her life, while at rest sitting in a car. It was accompanied by nausea, dizziness (like floating in air), no vomiting or sweating. It was 10/10, in the left chest, radiating to right jaw and right arm. There have been no recent sicknesses. She denies orthopnea, but reclines while sleeping because of sleep apnea. The patient has been having uncontrolled blood pressure lately and has started some new medications recently - like rivastigmine patch, HCTZ 25 and oxybutinin in the past 1 month. The patient also does take ibuprofen 800 mg orally as needed for pain.    The patient has been without chest pain since admission. She benefited from morphine administration.     Past medical history, social history and medications have been reviewed. The patient has no family history of heart disease, and she says that she was diagnosed of hypothyroidism long time ago, but was taken off the thyroid medication because she did no longer have the disease.   Review of systems as per HPI and resident note.   Filed Vitals:   05/04/12 1822 05/04/12 2003 05/04/12 2341 05/05/12 0400  BP: 161/65 133/71 118/63 148/96  Pulse: 58 63 73 77  Temp: 98.3 F (36.8 C) 97.6 F (36.4 C) 98.1 F (36.7  C) 98.8 F (37.1 C)  TempSrc: Oral Oral Oral   Resp: 20 18 18 16   Height: 5\' 4"  (1.626 m)     Weight: 167 lb 12.3 oz (76.1 kg)   168 lb 1.6 oz (76.25 kg)  SpO2: 96% 95% 95% 94%   Exam: General: Comfortably sitting in bed, spanish speaking, overweigh female. HEENT: No adenopathy, no JVD Heart: S1S2 normal, RRR, no murmurs Lungs: Clear to auscultation Abdomen: benign Extremities: bilateral pulses, no pedal edema.     Recent Labs Lab 05/04/12 1901 05/05/12 0011 05/05/12 0510  TROPONINI <0.30 <0.30 <0.30    Recent Labs Lab 05/04/12 1250 05/04/12 1901 05/05/12 0510  NA 133*  --  133*  K 4.8  --  4.1  CL 96  --  97  CO2 22  --  22  GLUCOSE 83  --  100*  BUN 16  --  18  CREATININE 0.71 0.66 0.71  CALCIUM 9.9  --  9.6    Recent Labs Lab 05/04/12 1250 05/04/12 1901  HGB 13.4 12.7  HCT 37.5 36.3  WBC 8.5 8.9  PLT 279 277   Lipid Panel     Component Value Date/Time   CHOL 198 05/05/2012 0510   TRIG 140 05/05/2012 0510   HDL 43 05/05/2012 0510   CHOLHDL 4.6 05/05/2012 0510   VLDL 28 05/05/2012 0510   LDLCALC 127* 05/05/2012 0510   Lab Results  Component Value Date   HGBA1C 6.1* 05/04/2012   TSH 6.1  EKG reviewed.  It does not show any changes significant for Acute MI at this time.    Imaging reviewed.   Medications . aspirin EC  81 mg Oral Daily  . atorvastatin  20 mg Oral Daily  . FLUoxetine  40 mg Oral Daily  . heparin  5,000 Units Subcutaneous Q8H  . hydrochlorothiazide  25 mg Oral Daily  . lisinopril  5 mg Oral Daily  . oxybutynin  5 mg Oral Daily  . pantoprazole  40 mg Oral Daily  . rivastigmine  9.5 mg Transdermal QHS  . sodium chloride  3 mL Intravenous Q12H  . sodium chloride  3 mL Intravenous Q12H  . traZODone  50 mg Oral QHS  . [START ON 05/06/2012] Vitamin D (Ergocalciferol)  50,000 Units Oral Q Mon    Assessment and Plan -Dizziness - Chest pain likely etiology atypical/unstable angina secondary to uncontrolled hypertension, or GERD -  Uncontrolled hypertension - Dyslipidemia - Elevated TSH 6.1 - Mild hyponatremia  I agree with the plan outlined by Dr. Zada Girt and Clyde Lundborg, with these additions:  Chest pain with TIMI 2 (age, aspirin use). Possibly unstable angina, or atypical chest pain secondary to uncontrolled hypertension. Cardiac enzymes negative. No EKG changes significant for acute MI. No signs, symptoms of CHF at this time. Dizziness could be a component of severe chest pain and transient high blood pressures. It is also found to be a side effect of Rivastigmine in upto 21% of patients. Orthostatics are negative. The patient appears euvolemic. Stress test can be done as outpatient - advice to PCP.     Dyslipidemia: Agree with statin.  Elevated TSH:  Likely Sick Euthyroid at this time. Can check Free T4. However, I would advise starting against any synthroid right now. Advise checking it in 4-8 weeks to reassess.   Hypertension: ON HCTZ and lisinopril. Better controlled blood pressures in hospital setting. Need stricter control as outpatient.  Okay to discharge.   Jlee Harkless 05/05/2012, 10:59 AM.

## 2012-05-05 NOTE — Progress Notes (Signed)
Utilization review completed.  

## 2012-05-05 NOTE — Discharge Summary (Signed)
Internal Medicine Teaching [facility] Discharge Note  ---- Note edited by Dr. Aletta Edouard ------  Name: Heidi Preston MRN: [id number] DOB: February 16, 1941 72 y.o.  Date of Admission: 05/04/2012 12:27 PM Date of Discharge: 05/05/2012 Attending Physician: Dr Aletta Edouard  Discharge Diagnosis:  Principal Problem Chest Pain etiology likely Atypical angina secondary to uncontrolled hypertesion and dyslipidemia or GERD induced  Other underlying problems Elevated TSH - likely sick Euthyroid, or uncontrolled hypothyroidism Dyslipidemia Depression GERD Headache Pulmonary Nodule  Discharge Medications:   Medication List    TAKE these medications       aspirin 81 MG EC tablet  Take 1 tablet (81 mg total) by mouth daily.     atorvastatin 20 MG tablet  Commonly known as:  LIPITOR  Take 1 tablet (20 mg total) by mouth daily.     esomeprazole 40 MG capsule  Commonly known as:  NEXIUM  Take 40 mg by mouth daily before breakfast.     FLUoxetine 40 MG capsule  Commonly known as:  PROZAC  Take 40 mg by mouth daily.     hydrochlorothiazide 25 MG tablet  Commonly known as:  HYDRODIURIL  Take 25 mg by mouth daily.     ibuprofen 800 MG tablet  Commonly known as:  ADVIL,MOTRIN  Take 1 tablet (800 mg total) by mouth every 8 (eight) hours as needed for pain. For pain     lisinopril 5 MG tablet  Commonly known as:  PRINIVIL,ZESTRIL  Take 1 tablet (5 mg total) by mouth daily.     nitroGLYCERIN 0.4 MG SL tablet  Commonly known as:  NITROSTAT  Place 1 tablet (0.4 mg total) under the tongue every 5 (five) minutes as needed for chest pain.     oxybutynin 5 MG tablet  Commonly known as:  DITROPAN  Take 5 mg by mouth daily.     rivastigmine 9.5 mg/24hr  Commonly known as:  EXELON  Place 1 patch onto the skin at bedtime.     traZODone 50 MG tablet  Commonly known as:  DESYREL  Take 50 mg by mouth at bedtime.     Vitamin D (Ergocalciferol) 50000 UNITS Caps  Commonly  known as:  DRISDOL  Take 50,000 Units by mouth every 7 (seven) days. Takes on Mon        Disposition and follow-up:   Heidi Preston was discharged from [facility] in Good condition.  At the hospital follow up visit please address   - Please consider referral to outpatient cardiology for stress test. - Please repeat her TSH level after about 4-8 weeks for further evaluation of possible hypothyroidism. TSH levels were elevated at 6.44 during her admission which could either be sick euthyroid syndrome or hypothyroidism. Free T4 levels were normal. - The patient's one time experience of dizziness could be secondary to severe chest pain. However, if the dizziness persists without chest pain, it might be worthwhile to reassess rivastigmine use as well, since dizziness is side effect of the medicine reported in up to 21% cases by some studies.   Follow-up Appointments: Follow-up Information   Follow up with MARTIN,NYKEDTRA, NP. Schedule an appointment as soon as possible for a visit in 1 week. (Please make appointment)    Contact information:   [facility] [address] [place] [address] [phone]      Discharge Orders   Future Orders Complete By Expires     Call MD for:  extreme fatigue  As directed  Call MD for:  persistant dizziness or light-headedness  As directed     Call MD for:  temperature >100.4  As directed     Diet - low sodium heart healthy  As directed     Diet - low sodium heart healthy  As directed     Increase activity slowly  As directed     Increase activity slowly  As directed        Consultations:    Procedures Performed:  Dg Chest 2 View  05/04/2012  *RADIOLOGY REPORT*  Clinical Data: Chest pain.  CHEST - 2 VIEW  Comparison: 05/10/2011  Findings: The cardiomediastinal silhouette is unremarkable. Pleural fat at the left costophrenic junction again noted. There is no evidence of focal airspace disease, pulmonary edema,  suspicious pulmonary nodule/mass, pleural effusion, or pneumothorax. No acute bony abnormalities are identified. Remote left rib fractures are present.  IMPRESSION: No evidence of active cardiopulmonary disease.   Original Report Authenticated By: Harmon Pier, M.D.     Admission HPI:  Chief Complaint: Chest pressure, and dizziness.  History of Present Illness:  Heidi Preston is a 72 year old, Spanish-speaking woman, with past medical history of hypertension, hypothyroidism, mild dementia, and chronic headaches, who presents to the ED, with an episode of chest pressure, and dizziness. Her daughter was at bedside assisted in translation.  The episode happened at around 9 AM on the morning of admission while she was riding in a car with her husband. She suddenly started experiencing dizziness and chest pressure on the right side of her chest. She is not sure she had any chest pain. She denies it radiating to the left jaw or left upper extremity or left shoulder. However, it radiated to the right side of upper arm and the right side of her face. She denies feeling shortness of breath or palpitations or diaphoresis. However, she felt lightheaded, and the sensation of floating in the air. She reported history of nausea, but no vomiting. The symptoms were intense for about 15 minutes, and gradually subsided over the course of about 1 hour. Her husband immediately drove her to the ED. At 4 PM when we evaluated her for admission, the symptoms had completely resolved and she was feeling normal. She and her husband had had a good breakfast prior to the car ride. She denies history of falls. She denies vertigo, auditory, visual changes, associated with the symptoms. She denied headaches even though she has a history of frequently headaches. She has never experienced such chest pain/pressure or discomfort. She denies any symptoms of weakness or loss of sensation or sensation of her upper extremity feeling heavy during the  attack of the symptoms. At baseline, Heidi Preston is an active woman, regularly exercising with her husband by walking in the park and she denies ever experiencing chest pain. She does not have a history of PND, swelling of her legs, or exertional dyspnea. She does not have orthopnea. She denies any weakness in her lower extremities.  She has no family history of heart disease, MI or stroke. She does not smoke, and she does not use aspirin.  Review of Systems:  Constitutional: negative for anorexia, chills, fatigue, fevers, malaise and sweats  Eyes: negative for cataracts, color blindness, irritation, redness and visual disturbance  Respiratory: negative for asthma, chronic bronchitis, cough, dyspnea on exertion, pleurisy/chest pain, pneumonia, sputum and wheezing  Gastrointestinal: positive for nausea, negative for change in bowel habits, constipation, diarrhea and vomiting  Genitourinary:negative  Musculoskeletal:negative  Physical Exam:  Blood  pressure 167/55, pulse 59, temperature 97.9 F (36.6 C), temperature source Oral, resp. rate 19, SpO2 96.00%.  General appearance: alert, cooperative, appears stated age, no distress, moderate distress and Husband and daughter at bedside.  Head: Normocephalic, without obvious abnormality, atraumatic  Eyes: conjunctivae/corneas clear. PERRL, EOM's intact. Fundi benign.  Ears: normal TM's and external ear canals both ears  Neck: no adenopathy, no carotid bruit, no JVD, supple, symmetrical, trachea midline and thyroid not enlarged, symmetric, no tenderness/mass/nodules  Back: symmetric, no curvature. ROM normal. No CVA tenderness.  Lungs: clear to auscultation bilaterally  Heart: regular rate and rhythm, S1, S2 normal, no murmur, click, rub or gallop  Abdomen: soft, non-tender; bowel sounds normal; no masses, no organomegaly  Extremities: extremities normal, atraumatic, no cyanosis or edema  Pulses: 2+ and symmetric  Lymph nodes: Cervical,  supraclavicular, and axillary nodes normal.  Neurologic: Alert and oriented X 3, normal strength and tone. Normal symmetric reflexes. Normal coordination and gait  Mental status: Alert, oriented, thought content appropriate, appearing a little anxious  Cranial nerves: normal  Sensory: normal  Motor: grossly normal  Reflexes: 2+ and symmetric  Coordination: Coordination with finger-to-nose normal. Romberg sign negative.  Gait: Patient is able to get on her feet from the bed without any assistance and she walked a short distance without feeling dizziness. The gait was normal.    Hospital Course by problem list:  Atypical Chest pain: Etiology unclear. The chest pain, sounded atypical. TIMI score of 2 with her only risk factors being hypertension, and age and aspirin use. Other causes like pulmonary embolism were not clinically suspected. Troponins are negative times 3 in the morning.. EKG revealed old T wave inversions in the precordial leads and flattening of T waves in the inferior leads. After one night of observation, she remained asymptomatic. She might need evaluation with the exercise stress test as outpatient. On teh other hand, this chest pain could be severe GERD, though unlikely, the possibility cannot be ruled out. Her described dizziness could be a component of severe chest pain and transient high blood pressures. It is also found to be a side effect of Rivastigmine in upto 21% of patients. Review of her medications will be deferred to her PCP.  Hypertension: Patient takes HCTZ, 25 mg once daily as her home regimen. Her blood pressure remained slightly elevated during hospital stay in the ranges of 140s to 160s over 80s. Lisinopril was added to her regimen. She'll be discharged on lisinopril to follow up as outpatient with her PCP. We avoided dromotropic antihypertensives due to her heart rates which are low at 54-67   Hyperlipidemia : LDL was 132 on 06/07/09. She is on Lipitor 20 mg daily  at home. repeat lipid panel revealed total cholesterol of 198, HDL 43, and LDL 127. Her 10 year risk for ASCVD was calculated is 20.5%, and the recommendation is to aim at moderate to high intensity statin therapy. She will continue with her Lipitor at 20 mg once daily. She will followup as outpatient.   Hypothyroidism: Patient carries diagnosis of hypothyroidism. She is not taking any medications at home. Her TSH was normal on 06/07/09. A repeat check TSH was slightly elevated at 6.13. Free T4 was normal. This could represent Euthyroid Sick Syndrome. She'll need outpatient repeat check with her PCP to determine if she needs treatment for hyperthyroidism.   She was discharged on home regimen of depression, including Prozac, and trazodone.    Discharge Vitals:  BP 148/96  Pulse 77  Temp(Src) 98.8 F (37.1 C) (Oral)  Resp 16  Ht 5\' 4"  (1.626 m)  Wt 168 lb 1.6 oz (76.25 kg)  BMI 28.84 kg/m2  SpO2 94%  Discharge Labs:  Results for orders placed during the hospital encounter of 05/04/12 (from the past 24 hour(s))  POCT I-STAT TROPONIN I     Status: None   Collection Time    05/04/12  5:30 PM      Result Value Range   Troponin i, poc 0.01  0.00 - 0.08 ng/mL   Comment 3           HEMOGLOBIN A1C     Status: Abnormal   Collection Time    05/04/12  7:01 PM      Result Value Range   Hemoglobin A1C 6.1 (*) <5.7 %   Mean Plasma Glucose 128 (*) <117 mg/dL  TSH     Status: Abnormal   Collection Time    05/04/12  7:01 PM      Result Value Range   TSH 6.138 (*) 0.350 - 4.500 uIU/mL  TROPONIN I     Status: None   Collection Time    05/04/12  7:01 PM      Result Value Range   Troponin I <0.30  <0.30 ng/mL  CBC     Status: None   Collection Time    05/04/12  7:01 PM      Result Value Range   WBC 8.9  4.0 - 10.5 K/uL   RBC 4.10  3.87 - 5.11 MIL/uL   Hemoglobin 12.7  12.0 - 15.0 g/dL   HCT 16.1  09.6 - 04.5 %   MCV 88.5  78.0 - 100.0 fL   MCH 31.0  26.0 - 34.0 pg   MCHC 35.0  30.0 - 36.0  g/dL   RDW 40.9  81.1 - 91.4 %   Platelets 277  150 - 400 K/uL  CREATININE, SERUM     Status: Abnormal   Collection Time    05/04/12  7:01 PM      Result Value Range   Creatinine, Ser 0.66  0.50 - 1.10 mg/dL   GFR calc non Af Amer 86 (*) >90 mL/min   GFR calc Af Amer >90  >90 mL/min  PROTIME-INR     Status: None   Collection Time    05/04/12  7:01 PM      Result Value Range   Prothrombin Time 13.3  11.6 - 15.2 seconds   INR 1.02  0.00 - 1.49  TROPONIN I     Status: None   Collection Time    05/05/12 12:11 AM      Result Value Range   Troponin I <0.30  <0.30 ng/mL  TROPONIN I     Status: None   Collection Time    05/05/12  5:10 AM      Result Value Range   Troponin I <0.30  <0.30 ng/mL  BASIC METABOLIC PANEL     Status: Abnormal   Collection Time    05/05/12  5:10 AM      Result Value Range   Sodium 133 (*) 135 - 145 mEq/L   Potassium 4.1  3.5 - 5.1 mEq/L   Chloride 97  96 - 112 mEq/L   CO2 22  19 - 32 mEq/L   Glucose, Bld 100 (*) 70 - 99 mg/dL   BUN 18  6 - 23 mg/dL   Creatinine, Ser 7.82  0.50 - 1.10 mg/dL  Calcium 9.6  8.4 - 10.5 mg/dL   GFR calc non Af Amer 84 (*) >90 mL/min   GFR calc Af Amer >90  >90 mL/min  LIPID PANEL     Status: Abnormal   Collection Time    05/05/12  5:10 AM      Result Value Range   Cholesterol 198  0 - 200 mg/dL   Triglycerides 161  <096 mg/dL   HDL 43  >04 mg/dL   Total CHOL/HDL Ratio 4.6     VLDL 28  0 - 40 mg/dL   LDL Cholesterol 540 (*) 0 - 99 mg/dL    Signed: Dow Adolph 05/05/2012, 3:37 PM   Time Spent on Discharge: 45 minutes  Services Ordered on Discharge: none  Equipment Ordered on Discharge:none  A the

## 2012-09-29 ENCOUNTER — Encounter (HOSPITAL_COMMUNITY): Payer: Self-pay

## 2012-09-29 ENCOUNTER — Emergency Department (HOSPITAL_COMMUNITY)
Admission: EM | Admit: 2012-09-29 | Discharge: 2012-09-29 | Disposition: A | Discharge status: Home / Self Care | Payer: Medicare Other | Attending: Emergency Medicine | Admitting: Emergency Medicine

## 2012-09-29 ENCOUNTER — Emergency Department (HOSPITAL_COMMUNITY): Payer: Medicare Other

## 2012-09-29 DIAGNOSIS — Z7982 Long term (current) use of aspirin: Secondary | ICD-10-CM | POA: Insufficient documentation

## 2012-09-29 DIAGNOSIS — E7800 Pure hypercholesterolemia, unspecified: Secondary | ICD-10-CM | POA: Insufficient documentation

## 2012-09-29 DIAGNOSIS — K625 Hemorrhage of anus and rectum: Secondary | ICD-10-CM | POA: Insufficient documentation

## 2012-09-29 DIAGNOSIS — Z8659 Personal history of other mental and behavioral disorders: Secondary | ICD-10-CM | POA: Insufficient documentation

## 2012-09-29 DIAGNOSIS — R51 Headache: Secondary | ICD-10-CM | POA: Insufficient documentation

## 2012-09-29 DIAGNOSIS — R1084 Generalized abdominal pain: Secondary | ICD-10-CM | POA: Insufficient documentation

## 2012-09-29 DIAGNOSIS — R197 Diarrhea, unspecified: Secondary | ICD-10-CM | POA: Insufficient documentation

## 2012-09-29 DIAGNOSIS — E039 Hypothyroidism, unspecified: Secondary | ICD-10-CM | POA: Insufficient documentation

## 2012-09-29 DIAGNOSIS — Z79899 Other long term (current) drug therapy: Secondary | ICD-10-CM | POA: Insufficient documentation

## 2012-09-29 DIAGNOSIS — Z90710 Acquired absence of both cervix and uterus: Secondary | ICD-10-CM | POA: Insufficient documentation

## 2012-09-29 DIAGNOSIS — M129 Arthropathy, unspecified: Secondary | ICD-10-CM | POA: Insufficient documentation

## 2012-09-29 DIAGNOSIS — R112 Nausea with vomiting, unspecified: Secondary | ICD-10-CM | POA: Insufficient documentation

## 2012-09-29 DIAGNOSIS — Z9889 Other specified postprocedural states: Secondary | ICD-10-CM | POA: Insufficient documentation

## 2012-09-29 DIAGNOSIS — I10 Essential (primary) hypertension: Secondary | ICD-10-CM | POA: Insufficient documentation

## 2012-09-29 DIAGNOSIS — Z8669 Personal history of other diseases of the nervous system and sense organs: Secondary | ICD-10-CM | POA: Insufficient documentation

## 2012-09-29 LAB — CBC WITH DIFFERENTIAL/PLATELET
Eosinophils Relative: 3 % (ref 0–5)
HCT: 35 % — ABNORMAL LOW (ref 36.0–46.0)
Lymphocytes Relative: 19 % (ref 12–46)
Lymphs Abs: 1.4 10*3/uL (ref 0.7–4.0)
MCV: 92.3 fL (ref 78.0–100.0)
Monocytes Absolute: 0.8 10*3/uL (ref 0.1–1.0)
RBC: 3.79 MIL/uL — ABNORMAL LOW (ref 3.87–5.11)
WBC: 7.3 10*3/uL (ref 4.0–10.5)

## 2012-09-29 LAB — COMPREHENSIVE METABOLIC PANEL
ALT: 17 U/L (ref 0–35)
CO2: 22 mEq/L (ref 19–32)
Calcium: 9.3 mg/dL (ref 8.4–10.5)
Creatinine, Ser: 0.64 mg/dL (ref 0.50–1.10)
GFR calc Af Amer: >90 mL/min (ref >90)
GFR calc non Af Amer: 86 mL/min — ABNORMAL LOW (ref >90)
Glucose, Bld: 93 mg/dL (ref 70–99)
Sodium: 134 mEq/L — ABNORMAL LOW (ref 135–145)

## 2012-09-29 MED ORDER — SODIUM CHLORIDE 0.9 % IV SOLN
1000.0000 mL | Freq: Once | INTRAVENOUS | Status: AC
  Administered 2012-09-29: 1000 mL via INTRAVENOUS

## 2012-09-29 MED ORDER — PROMETHAZINE HCL 25 MG PO TABS: 25.0000 mg | ORAL_TABLET | Freq: Four times a day (QID) | ORAL | Status: DC | PRN

## 2012-09-29 MED ORDER — IOHEXOL 300 MG/ML  SOLN
100.0000 mL | Freq: Once | INTRAMUSCULAR | Status: AC | PRN
  Administered 2012-09-29: 100 mL via INTRAVENOUS

## 2012-09-29 MED ORDER — LORAZEPAM 2 MG/ML IJ SOLN
0.5000 mg | Freq: Once | INTRAMUSCULAR | Status: AC
  Administered 2012-09-29: 0.5 mg via INTRAVENOUS
  Filled 2012-09-29: qty 1

## 2012-09-29 MED ORDER — MORPHINE SULFATE 4 MG/ML IJ SOLN
6.0000 mg | Freq: Once | INTRAMUSCULAR | Status: AC
  Administered 2012-09-29: 6 mg via INTRAVENOUS
  Filled 2012-09-29: qty 2

## 2012-09-29 MED ORDER — IOHEXOL 300 MG/ML  SOLN
50.0000 mL | Freq: Once | INTRAMUSCULAR | Status: AC | PRN
  Administered 2012-09-29: 50 mL via ORAL

## 2012-09-29 MED ORDER — ONDANSETRON HCL 4 MG/2ML IJ SOLN
4.0000 mg | Freq: Once | INTRAMUSCULAR | Status: AC
  Administered 2012-09-29: 4 mg via INTRAVENOUS
  Filled 2012-09-29: qty 2

## 2012-09-29 MED ORDER — SODIUM CHLORIDE 0.9 % IV SOLN
1000.0000 mL | INTRAVENOUS | Status: DC
  Administered 2012-09-29: 1000 mL via INTRAVENOUS

## 2012-09-29 NOTE — ED Provider Notes (Signed)
CSN: [id number]     Arrival date & time 09/29/12  7829 History     First MD Initiated Contact with Patient 09/29/12 [phone]     Chief Complaint  Patient presents with  . Rectal Bleeding    Diarrhea x 3 days.    HPI Patient reports nausea vomiting and diarrhea over the past 72 hours.  She reports today she has small amount of blood come out into the toilet after an episode of diarrhea.  She denies rectal pain.  She reports generalized abdominal pain throughout her abdomen.  She saw her primary care physician for similar symptoms 2 days ago and was prescribed Zofran but she was unable to afford this medication and therefore she's been vomiting at home.  She reports headache and decreased oral intake over the past 48 hours.  No fever but does have reported chills at home.  No recent sick contacts.  No recent antibiotics.  Her recent hospitalization.  Her symptoms are mild to moderate in severity.  Nothing worsens or improves her symptoms   Past Medical History  Diagnosis Date  . Anxiety   . Arthritis   . Hypertension   . Blepharitis, both eyes   . Esophagitis, reflux   . Hypercholesteremia   . Fatigue   . Hypothyroid    Past Surgical History  Procedure Laterality Date  . Total abdominal hysterectomy    . Cholecystectomy    . Shoulder surgery     History reviewed. No pertinent family history. History  Substance Use Topics  . Smoking status: Never Smoker   . Smokeless tobacco: Not on file  . Alcohol Use: No   OB History   Grav Para Term Preterm Abortions TAB SAB Ect Mult Living                 Review of Systems  All other systems reviewed and are negative.    Allergies  Review of patient's allergies indicates no known allergies.  Home Medications   Current Outpatient Rx  Name  Route  Sig  Dispense  Refill  . aspirin EC 81 MG EC tablet   Oral   Take 1 tablet (81 mg total) by mouth daily.   30 tablet   0   . atorvastatin (LIPITOR) 20 MG tablet   Oral   Take 1  tablet (20 mg total) by mouth daily.   30 tablet   0   . esomeprazole (NEXIUM) 40 MG capsule   Oral   Take 40 mg by mouth daily before breakfast.         . HYDROcodone-acetaminophen (NORCO) 10-325 MG per tablet   Oral   Take 1 tablet by mouth every 6 (six) hours as needed for pain.         . hyoscyamine (LEVSIN SL) 0.125 MG SL tablet   Sublingual   Place 0.125 mg under the tongue every 4 (four) hours as needed for cramping (stomach cramping).         . nitroGLYCERIN (NITROSTAT) 0.4 MG SL tablet   Sublingual   Place 1 tablet (0.4 mg total) under the tongue every 5 (five) minutes as needed for chest pain.   10 tablet   0   . rivastigmine (EXELON) 9.5 mg/24hr   Transdermal   Place 1 patch onto the skin at bedtime.          . Vitamin D, Ergocalciferol, (DRISDOL) 50000 UNITS CAPS capsule   Oral   Take 50,000 Units by mouth every  Friday.         Marland Kitchen EXPIRED: promethazine (PHENERGAN) 25 MG tablet   Oral   Take 1 tablet (25 mg total) by mouth every 6 (six) hours as needed for nausea.   12 tablet   0   . promethazine (PHENERGAN) 25 MG tablet   Oral   Take 1 tablet (25 mg total) by mouth every 6 (six) hours as needed for nausea.   30 tablet   0    BP 176/73  Pulse 83  Temp(Src) 97.8 F (36.6 C) (Oral)  Resp 14  SpO2 100% Physical Exam  Nursing note and vitals reviewed. Constitutional: She is oriented to person, place, and time. She appears well-developed and well-nourished. No distress.  HENT:  Head: Normocephalic and atraumatic.  Eyes: EOM are normal.  Neck: Normal range of motion.  Cardiovascular: Normal rate, regular rhythm and normal heart sounds.   Pulmonary/Chest: Effort normal and breath sounds normal.  Abdominal: Soft. She exhibits no distension.  Mild diffuse abdominal tenderness.  No guarding or rebound.  Musculoskeletal: Normal range of motion.  Neurological: She is alert and oriented to person, place, and time.  Skin: Skin is warm and dry.   Psychiatric: She has a normal mood and affect. Judgment normal.    ED Course   Procedures (including critical care time)  Labs Reviewed  CBC WITH DIFFERENTIAL - Abnormal; Notable for the following:    RBC 3.79 (*)    HCT 35.0 (*)    All other components within normal limits  COMPREHENSIVE METABOLIC PANEL - Abnormal; Notable for the following:    Sodium 134 (*)    Albumin 3.4 (*)    Total Bilirubin 0.2 (*)    GFR calc non Af Amer 86 (*)    All other components within normal limits  CLOSTRIDIUM DIFFICILE BY PCR  STOOL CULTURE  URINALYSIS, ROUTINE W REFLEX MICROSCOPIC   Ct Abdomen Pelvis W Contrast  09/29/2012   *RADIOLOGY REPORT*  Clinical Data: Nausea with multiple episodes of diarrhea over past 3 days.  History of reflux.  Post cholecystectomy and hysterectomy. Hypertension.  Hypercholesterolemia.  CT ABDOMEN AND PELVIS WITH CONTRAST  Technique:  Multidetector CT imaging of the abdomen and pelvis was performed following the standard protocol during bolus administration of intravenous contrast.  Contrast: OMNIPAQUE IOHEXOL 300 MG/ML  SOLN, 50mL OMNIPAQUE IOHEXOL 300 MG/ML  SOLN  Comparison: None.  Findings: Colonic diverticula most notable descending colon and sigmoid colon.  No extraluminal bowel inflammatory process, free fluid or free air.  Fatty infiltration liver.  No worrisome hepatic, splenic, pancreatic, adrenal or renal lesion.  Post cholecystectomy.  Cardiomegaly.  Atherosclerotic type changes of the aorta without aneurysmal dilation.  No bowel containing hernia.  Degenerative changes lower lumbar spine.  Noncontrast filled views of the urinary bladder unremarkable.  Post hysterectomy.  Adnexal region unremarkable.  No adenopathy.  IMPRESSION:  Colonic diverticula most notable descending colon and sigmoid colon.  No extraluminal bowel inflammatory process, free fluid or free air.  Fatty infiltration liver.  Cardiomegaly.   Original Report Authenticated By: Lacy Duverney, M.D.    I personally reviewed the imaging tests through PACS system I reviewed available ER/hospitalization records through the EMR   1. Nausea vomiting and diarrhea     MDM  10:56 AM Patient feels much better at this time.  CT scan is without acute abdominal pathology.  Vital signs are normal.  Hydrated in the emergency department.  Home with Phenergan which she will be a  little before.  She understands return to the ER for new or worsening symptoms.  Her bleeding sounds more hemorrhoidal in nature.  Doubt upper GI or lower GI bleed.  Lyanne Co, MD 09/29/12 1058

## 2012-09-29 NOTE — ED Notes (Signed)
[id number]<AV> Expected date:<BR> Expected time:<BR> Means of arrival:<BR> Comments:<BR> 72 y/o F rectal bleed

## 2012-09-29 NOTE — ED Notes (Signed)
At this time she is assisted to b.r. By our tech., Fleet Contras.  She states the medicine has helped, and she ambulates slowly and capably.

## 2012-09-29 NOTE — ED Notes (Signed)
Patient used restroom, was only aware that we needed a stool specimen. Will get urine specimen next time she uses restroom. RN  Tim notified

## 2012-09-29 NOTE — ED Notes (Signed)
She c/o having several (~ 20 per day) diarrhea stools/day x 3 days.  She also c/o nausea and diffuse abd. Discomfort/denies vomiting.  She further states she underwent left shoulder surgery 2 weeks ago.  She is in no distress, and her family are with her.

## 2012-10-28 ENCOUNTER — Other Ambulatory Visit (HOSPITAL_COMMUNITY): Payer: Self-pay | Admitting: Internal Medicine

## 2012-11-02 ENCOUNTER — Other Ambulatory Visit (HOSPITAL_COMMUNITY): Payer: Self-pay | Admitting: Internal Medicine

## 2013-02-26 DIAGNOSIS — G4710 Hypersomnia, unspecified: Secondary | ICD-10-CM | POA: Insufficient documentation

## 2013-02-26 DIAGNOSIS — G479 Sleep disorder, unspecified: Secondary | ICD-10-CM | POA: Insufficient documentation

## 2013-02-26 DIAGNOSIS — F411 Generalized anxiety disorder: Secondary | ICD-10-CM | POA: Insufficient documentation

## 2013-02-26 DIAGNOSIS — E785 Hyperlipidemia, unspecified: Secondary | ICD-10-CM | POA: Insufficient documentation

## 2013-02-26 DIAGNOSIS — R413 Other amnesia: Secondary | ICD-10-CM | POA: Insufficient documentation

## 2013-02-26 DIAGNOSIS — M1990 Unspecified osteoarthritis, unspecified site: Secondary | ICD-10-CM | POA: Insufficient documentation

## 2013-02-26 DIAGNOSIS — G4733 Obstructive sleep apnea (adult) (pediatric): Secondary | ICD-10-CM | POA: Insufficient documentation

## 2013-02-26 DIAGNOSIS — F0280 Dementia in other diseases classified elsewhere without behavioral disturbance: Secondary | ICD-10-CM | POA: Insufficient documentation

## 2013-07-02 DIAGNOSIS — Z9849 Cataract extraction status, unspecified eye: Secondary | ICD-10-CM | POA: Insufficient documentation

## 2015-04-13 DIAGNOSIS — E669 Obesity, unspecified: Secondary | ICD-10-CM | POA: Insufficient documentation

## 2015-04-13 DIAGNOSIS — F3342 Major depressive disorder, recurrent, in full remission: Secondary | ICD-10-CM | POA: Insufficient documentation

## 2015-04-13 DIAGNOSIS — N1831 Hypertensive chronic kidney disease with stage 1 through stage 4 chronic kidney disease, or unspecified chronic kidney disease: Secondary | ICD-10-CM | POA: Insufficient documentation

## 2015-04-13 DIAGNOSIS — F411 Generalized anxiety disorder: Secondary | ICD-10-CM | POA: Insufficient documentation

## 2015-07-19 DIAGNOSIS — H269 Unspecified cataract: Secondary | ICD-10-CM | POA: Insufficient documentation

## 2015-07-19 DIAGNOSIS — M8580 Other specified disorders of bone density and structure, unspecified site: Secondary | ICD-10-CM | POA: Insufficient documentation

## 2015-11-15 ENCOUNTER — Encounter: Payer: Self-pay | Admitting: Gastroenterology

## 2015-12-30 ENCOUNTER — Ambulatory Visit (INDEPENDENT_AMBULATORY_CARE_PROVIDER_SITE_OTHER): Payer: Medicare Other | Admitting: Gastroenterology

## 2015-12-30 ENCOUNTER — Encounter (INDEPENDENT_AMBULATORY_CARE_PROVIDER_SITE_OTHER): Payer: Self-pay

## 2015-12-30 ENCOUNTER — Encounter: Payer: Self-pay | Admitting: Gastroenterology

## 2015-12-30 VITALS — BP 164/86 | HR 54 | Ht 63.25 in | Wt 172.2 lb

## 2015-12-30 DIAGNOSIS — R110 Nausea: Secondary | ICD-10-CM

## 2015-12-30 DIAGNOSIS — R140 Abdominal distension (gaseous): Secondary | ICD-10-CM

## 2015-12-30 DIAGNOSIS — A048 Other specified bacterial intestinal infections: Secondary | ICD-10-CM

## 2015-12-30 DIAGNOSIS — K625 Hemorrhage of anus and rectum: Secondary | ICD-10-CM

## 2015-12-30 DIAGNOSIS — R194 Change in bowel habit: Secondary | ICD-10-CM | POA: Diagnosis not present

## 2015-12-30 DIAGNOSIS — R1032 Left lower quadrant pain: Secondary | ICD-10-CM

## 2015-12-30 DIAGNOSIS — K219 Gastro-esophageal reflux disease without esophagitis: Secondary | ICD-10-CM

## 2015-12-30 NOTE — Progress Notes (Signed)
[name] Consult Note:  History: Heidi Preston 12/30/2015  Referring physician: No primary care provider on file.  Reason for consult/chief complaint: Emesis (resolved); Rectal Bleeding (dark in color x 3 days); Gas; weight change (wt gain of 20 lbs); and Cough (x 2-3 yrs)   Subjective  HPI:  This is a 75 year old Spanish-speaking woman who presents with her son today for second opinion on a multitude of symptoms. Her son is English-speaking and is also a patient of mine. She reports perhaps 3 years of a chronic cough which is apparently had a extensive unrevealing workup. None of those records are available today. I was able to review a recent primary care office note from her physician: There is no mention of the chronic cough, and hyoscyamine was prescribed for her abdominal symptoms. This patient apparently saw Dr. Jeani HawkingPatrick Hung in March 2013, we were able to get a procedure report from his office faxed over today. It seems she had an upper endoscopy with placement of a bravo pH capsule, presumably to look for reflux as a cause of cough. There is no report from the bravo study, but the son reports he was told it was normal. Biopsies showed chronic gastritis which was H. pylori positive, and notes indicate that they prescribed Pylera. Patient has no recollection of this prescription. She also had a small tubular adenoma, and random biopsies were negative for microscopic colitis. Internal hemorrhoids were also noted. Patient has a multitude of GI symptoms including abdominal bloating nausea, bowel habits that alternate between diarrhea and constipation, and some recent small-volume rectal bleeding. She is bothered that she is gaining perhaps 20 pounds in the last 6 months without clear cause. She had her husband were hoping for further evaluation of her chronic cough today. ROS:  Review of Systems  Constitutional: Negative for appetite change and unexpected weight change.   HENT: Negative for mouth sores and voice change.   Eyes: Negative for pain and redness.  Respiratory: Negative for cough and shortness of breath.   Cardiovascular: Negative for chest pain and palpitations.  Genitourinary: Negative for dysuria and hematuria.  Musculoskeletal: Negative for arthralgias and myalgias.  Skin: Negative for pallor and rash.  Neurological: Negative for weakness and headaches.  Hematological: Negative for adenopathy.  Psychiatric/Behavioral: The patient is nervous/anxious.      Past Medical History: Past Medical History:  Diagnosis Date  . Anxiety   . Arthritis   . Blepharitis, both eyes   . Esophagitis, reflux   . Fatigue   . Hypercholesteremia   . Hypertension   . Hypothyroid      Past Surgical History: Past Surgical History:  Procedure Laterality Date  . CHOLECYSTECTOMY    . SHOULDER SURGERY    . TOTAL ABDOMINAL HYSTERECTOMY       Family History: Family History  Problem Relation Age of Onset  . Colon cancer Neg Hx   . Stomach cancer Neg Hx   . Esophageal cancer Neg Hx   . Rectal cancer Neg Hx   . Liver cancer Neg Hx     Social History: Social History   Social History  . Marital status: Married    Spouse name: N/A  . Number of children: 4  . Years of education: N/A   Occupational History  . housewife    Social History Main Topics  . Smoking status: Never Smoker  . Smokeless tobacco: Never Used  . Alcohol use No  . Drug use: No  . Sexual activity: No  Other Topics Concern  . None   Social History Narrative  . None    Allergies: No Known Allergies  Outpatient Meds: Current Outpatient Prescriptions  Medication Sig Dispense Refill  . aspirin EC 81 MG EC tablet Take 1 tablet (81 mg total) by mouth daily. 30 tablet 0  . atorvastatin (LIPITOR) 20 MG tablet Take 1 tablet (20 mg total) by mouth daily. 30 tablet 0  . benazepril-hydrochlorthiazide (LOTENSIN HCT) 5-6.25 MG tablet     . esomeprazole (NEXIUM) 40 MG  capsule Take 40 mg by mouth daily before breakfast.    . FLUoxetine (PROZAC) 10 MG tablet Take 20 mg by mouth daily.    Marland Kitchen. levothyroxine (SYNTHROID, LEVOTHROID) 50 MCG tablet     . metoprolol tartrate (LOPRESSOR) 25 MG tablet TAKE 1 TABLET BY MOUTH TWICE A DAY    . ranitidine (ZANTAC) 150 MG tablet      No current facility-administered medications for this visit.       ___________________________________________________________________ Objective   Exam:  BP (!) 164/86   Pulse (!) 54   Ht 5' 3.25" (1.607 m)   Wt 172 lb 4 oz (78.1 kg)   BMI 30.27 kg/m    General: this is a(n) Anxious-appearing elderly Latina woman . She is otherwise well appearing as good muscle mass  Eyes: sclera anicteric, no redness  ENT: oral mucosa moist without lesions, no cervical or supraclavicular lymphadenopathy, good dentition  CV: RRR without murmur, S1/S2, no JVD, no peripheral edema  Resp: clear to auscultation bilaterally, normal RR and effort noted  GI: soft, no tenderness, with active bowel sounds. No guarding or palpable organomegaly noted. She points to her lower abdomen is location of her intermittent pain.  Skin; warm and dry, no rash or jaundice noted  Neuro: awake, alert and oriented x 3. Normal gross motor function and fluent speech Rectal (exam chaperoned my MA Toni): nml external, no fissure, normal internal, no mass or tenderness  Labs: Outside records as noted above  Assessment: Encounter Diagnoses  Name Primary?  . Abdominal pain, left lower quadrant Yes  . Abdominal bloating   . Nausea without vomiting   . Altered bowel habits   . Rectal bleeding   . Gastroesophageal reflux disease without esophagitis   . H. 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 Her H. pylori is of doubtful relation to these, but is not clear if it was ever treated.   Plan:  H. pylori stool antigen  after she is off PPI for a week She needs ongoing treatment for her anxiety with primary care As needed Levsin if she has cramps and diarrhea As needed Preparation H suppository if rectal bleeding recurs Dietary advice was given for IBS  No further testing planned at this point  Thank you for the courtesy of this consult.  Please call me with any questions or concerns.  Charlie PitterHenry L Danis III  CC: No primary care provider on file.

## 2015-12-30 NOTE — Patient Instructions (Addendum)
Food Guidelines for a sensitive stomach  Many people have difficulty digesting certain foods, causing a variety of distressing and embarrassing symptoms such as abdominal pain, bloating and gas.  These foods may need to be avoided or consumed in small amounts.  Here are some tips that might be helpful for you.  1.   Lactose intolerance is the difficulty or complete inability to digest lactose, the natural sugar in milk and anything made from milk.  This condition is harmless, common, and can begin any time during life.  Some people can digest a modest amount of lactose while others cannot tolerate any.  Also, not all dairy products contain equal amounts of lactose.  For example, hard cheeses such as parmesan have less lactose than soft cheeses such as cheddar.  Yogurt has less lactose than milk or cheese.  Many packaged foods (even many brands of bread) have milk, so read ingredient lists carefully.  It is difficult to test for lactose intolerance, so just try avoiding lactose as much as possible for a week and see what happens with your symptoms.  If you seem to be lactose intolerant, the best plan is to avoid it (but make sure you get calcium from another source).  The next best thing is to use lactase enzyme supplements, available over the counter everywhere.  Just know that many lactose intolerant people need to take several tablets with each serving of dairy to avoid symptoms.  Lastly, a lot of restaurant food is made with milk or butter.  Many are things you might not suspect, such as mashed potatoes, rice and pasta (cooked with butter) and "grilled" items.  If you are lactose intolerant, it never hurts to ask your server what has milk or butter.  2.   Fiber is an important part of your diet, but not all fiber is well-tolerated.  Insoluble fiber such as bran is often consumed by normal gut bacteria and converted into gas.  Soluble fiber such as oats, squash, carrots and green beans are typically  tolerated better.  3.   Some types of carbohydrates can be poorly digested.  Examples include: fructose (apples, cherries, pears, raisins and other dried fruits), fructans (onions, zucchini, large amounts of wheat), sorbitol/mannitol/xylitol and sucralose/Splenda (common artificial sweeteners), and raffinose (lentils, broccoli, cabbage, asparagus, brussel sprouts, many types of beans).  Do a Programmer, multimediaweb search for National City[place]ODMAP diet and you will find helpful information. Beano, a dietary supplement, will often help with raffinose-containing foods.  As with lactase tablets, you may need several per serving.  4.   Whenever possible, avoid processed food&meats and chemical additives.  High fructose corn syrup, a common sweetener, may be difficult to digest.  Eggs and soy (comes from the soybean, and added to many foods now) are the other most common bloating/gassy foods.  - Dr. Sherlynn CarbonHenry Danis [facility]  ______________________________________________   We will make arrangements for you to have a stool test to check for the H. Pylori bacteria.  You must be off esomeporazole and ranitidine for a weeks prior to this test.  Use the hyoscyamine that your doctor prescribed when you have lower abdominal cramps and diarrhea.  Preparation H suppositories can be used as needed at bedtime if there is hemorrhoidal bleeding.  If you are age 77 or older, your body mass index should be between 23-30. Your Body mass index is 30.27 kg/m. If this is out of the aforementioned range listed, please consider follow up with your Primary Care Provider.  If  you are age 77 or younger, your body mass index should be between 19-25. Your Body mass index is 30.27 kg/m. If this is out of the aformentioned range listed, please consider follow up with your Primary Care Provider.   Thank you for choosing [facility]  Dr Amada JupiterHenry Danis III

## 2016-01-05 ENCOUNTER — Other Ambulatory Visit: Payer: Medicare Other

## 2016-01-05 DIAGNOSIS — A048 Other specified bacterial intestinal infections: Secondary | ICD-10-CM

## 2016-01-05 DIAGNOSIS — R140 Abdominal distension (gaseous): Secondary | ICD-10-CM

## 2016-01-05 DIAGNOSIS — R194 Change in bowel habit: Secondary | ICD-10-CM

## 2016-01-05 DIAGNOSIS — R110 Nausea: Secondary | ICD-10-CM

## 2016-01-05 DIAGNOSIS — K219 Gastro-esophageal reflux disease without esophagitis: Secondary | ICD-10-CM

## 2016-01-05 DIAGNOSIS — K625 Hemorrhage of anus and rectum: Secondary | ICD-10-CM

## 2016-01-05 DIAGNOSIS — R1032 Left lower quadrant pain: Secondary | ICD-10-CM

## 2016-01-07 ENCOUNTER — Encounter: Payer: Self-pay | Admitting: Gastroenterology

## 2016-01-07 LAB — H. PYLORI ANTIGEN, STOOL: H PYLORI AG STL: NEGATIVE

## 2016-01-10 NOTE — Progress Notes (Signed)
Letter mailed

## 2016-01-21 ENCOUNTER — Ambulatory Visit: Payer: Medicare Other | Admitting: Gastroenterology

## 2016-01-26 DIAGNOSIS — G8929 Other chronic pain: Secondary | ICD-10-CM | POA: Insufficient documentation

## 2016-03-06 ENCOUNTER — Encounter (HOSPITAL_COMMUNITY): Payer: Self-pay | Admitting: Oncology

## 2016-03-06 ENCOUNTER — Emergency Department (HOSPITAL_COMMUNITY)
Admission: EM | Admit: 2016-03-06 | Discharge: 2016-03-06 | Disposition: A | Discharge status: Home / Self Care | Payer: Medicare Other | Attending: Emergency Medicine | Admitting: Emergency Medicine

## 2016-03-06 ENCOUNTER — Emergency Department (HOSPITAL_COMMUNITY): Payer: Medicare Other

## 2016-03-06 DIAGNOSIS — Z79899 Other long term (current) drug therapy: Secondary | ICD-10-CM | POA: Diagnosis not present

## 2016-03-06 DIAGNOSIS — R109 Unspecified abdominal pain: Secondary | ICD-10-CM | POA: Insufficient documentation

## 2016-03-06 DIAGNOSIS — J111 Influenza due to unidentified influenza virus with other respiratory manifestations: Secondary | ICD-10-CM | POA: Diagnosis not present

## 2016-03-06 DIAGNOSIS — E039 Hypothyroidism, unspecified: Secondary | ICD-10-CM | POA: Diagnosis not present

## 2016-03-06 DIAGNOSIS — I10 Essential (primary) hypertension: Secondary | ICD-10-CM | POA: Diagnosis not present

## 2016-03-06 DIAGNOSIS — Z7982 Long term (current) use of aspirin: Secondary | ICD-10-CM | POA: Insufficient documentation

## 2016-03-06 DIAGNOSIS — R69 Illness, unspecified: Secondary | ICD-10-CM

## 2016-03-06 DIAGNOSIS — R05 Cough: Secondary | ICD-10-CM | POA: Diagnosis present

## 2016-03-06 LAB — COMPREHENSIVE METABOLIC PANEL
ALT: 21 U/L (ref 14–54)
AST: 27 U/L (ref 15–41)
Albumin: 3.9 g/dL (ref 3.5–5.0)
Alkaline Phosphatase: 59 U/L (ref 38–126)
Anion gap: 8 (ref 5–15)
BUN: 12 mg/dL (ref 6–20)
CO2: 27 mmol/L (ref 22–32)
Calcium: 9.3 mg/dL (ref 8.9–10.3)
Chloride: 98 mmol/L — ABNORMAL LOW (ref 101–111)
Creatinine, Ser: 0.75 mg/dL (ref 0.44–1.00)
GFR calc Af Amer: >60 mL/min (ref >60)
GFR calc non Af Amer: >60 mL/min (ref >60)
Glucose, Bld: 104 mg/dL — ABNORMAL HIGH (ref 65–99)
Potassium: 4.4 mmol/L (ref 3.5–5.1)
Sodium: 133 mmol/L — ABNORMAL LOW (ref 135–145)
Total Bilirubin: 0.8 mg/dL (ref 0.3–1.2)
Total Protein: 7.2 g/dL (ref 6.5–8.1)

## 2016-03-06 LAB — CBC WITH DIFFERENTIAL/PLATELET
Basophils Absolute: 0 10*3/uL (ref 0.0–0.1)
Basophils Relative: 0 %
Eosinophils Absolute: 0 10*3/uL (ref 0.0–0.7)
Eosinophils Relative: 0 %
HCT: 38.1 % (ref 36.0–46.0)
Hemoglobin: 13 g/dL (ref 12.0–15.0)
Lymphocytes Relative: 20 %
Lymphs Abs: 1.8 10*3/uL (ref 0.7–4.0)
MCH: 30.9 pg (ref 26.0–34.0)
MCHC: 34.1 g/dL (ref 30.0–36.0)
MCV: 90.5 fL (ref 78.0–100.0)
Monocytes Absolute: 0.8 10*3/uL (ref 0.1–1.0)
Monocytes Relative: 9 %
Neutro Abs: 6.1 10*3/uL (ref 1.7–7.7)
Neutrophils Relative %: 71 %
Platelets: 313 10*3/uL (ref 150–400)
RBC: 4.21 MIL/uL (ref 3.87–5.11)
RDW: 13.3 % (ref 11.5–15.5)
WBC: 8.7 10*3/uL (ref 4.0–10.5)

## 2016-03-06 LAB — I-STAT CG4 LACTIC ACID, ED: Lactic Acid, Venous: 1.66 mmol/L (ref 0.5–1.9)

## 2016-03-06 LAB — URINALYSIS, ROUTINE W REFLEX MICROSCOPIC
Bilirubin Urine: NEGATIVE
Glucose, UA: NEGATIVE mg/dL
Hgb urine dipstick: NEGATIVE
Ketones, ur: NEGATIVE mg/dL
Leukocytes, UA: NEGATIVE
Nitrite: NEGATIVE
Protein, ur: NEGATIVE mg/dL
Specific Gravity, Urine: 1.008 (ref 1.005–1.030)
pH: 8 (ref 5.0–8.0)

## 2016-03-06 LAB — TROPONIN I: Troponin I: <0.03 ng/mL (ref <0.03)

## 2016-03-06 MED ORDER — PROMETHAZINE-DM 6.25-15 MG/5ML PO SYRP: 5.0000 mL | ORAL_SOLUTION | Freq: Four times a day (QID) | ORAL | 0 refills | Status: DC | PRN

## 2016-03-06 MED ORDER — GUAIFENESIN ER 1200 MG PO TB12: 1.0000 | ORAL_TABLET | Freq: Two times a day (BID) | ORAL | 0 refills | Status: DC

## 2016-03-06 MED ORDER — IOPAMIDOL (ISOVUE-370) INJECTION 76%
INTRAVENOUS | Status: AC
  Filled 2016-03-06: qty 100

## 2016-03-06 MED ORDER — IBUPROFEN 800 MG PO TABS: 800.0000 mg | ORAL_TABLET | Freq: Three times a day (TID) | ORAL | 0 refills | Status: DC | PRN

## 2016-03-06 MED ORDER — SODIUM CHLORIDE 0.9 % IV BOLUS (SEPSIS)
1000.0000 mL | Freq: Once | INTRAVENOUS | Status: AC
  Administered 2016-03-06: 1000 mL via INTRAVENOUS

## 2016-03-06 MED ORDER — IOPAMIDOL (ISOVUE-370) INJECTION 76%
100.0000 mL | Freq: Once | INTRAVENOUS | Status: AC | PRN
  Administered 2016-03-06: 100 mL via INTRAVENOUS

## 2016-03-06 NOTE — ED Provider Notes (Signed)
[facility]-EMERGENCY DEPT Provider Note   CSN: [id number] Arrival date & time: 03/06/16  1308     History   Chief Complaint Chief Complaint  Patient presents with  . Flu Like Sx    HPI Heidi Preston is a 76 y.o. female.  HPI Patient presents to the emergency department with flulike symptoms over the last 10 days.  The patient had body aches, cough, headache, nausea with diarrhea.  The patient states that nothing seems make the condition better or worse.  The patient Past Medical History:  Diagnosis Date  . Anxiety   . Arthritis   . Blepharitis, both eyes   . Esophagitis, reflux   . Fatigue   . Hypercholesteremia   . Hypertension   . Hypothyroid     Patient Active Problem List   Diagnosis Date Noted  . Chest pain 05/04/2012  . Urinary incontinence 05/04/2012  . Pulmonary nodule 05/25/2011  . Chronic cough 10/05/2009  . KNEE PAIN, LEFT 05/19/2009  . INSOMNIA 03/08/2009  . HYPERTENSION, BENIGN ESSENTIAL 02/17/2009  . PAIN IN JOINT, MULTIPLE SITES 02/17/2009  . OSTEOARTHRITIS 11/23/2008  . POSTHERPETIC NEURALGIA 09/04/2008  . 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 UTERINE PROLAPSE 01/01/2008  . HYPOTHYROIDISM 09/01/2006  . 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 OBESITY NOS 09/01/2006  . DEPRESSION 09/01/2006  . GASTROESOPHAGEAL REFLUX DISEASE 09/01/2006  . VAGINITIS, ATROPHIC 09/01/2006  . 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 Headache(784.0) 09/01/2006  . HYPERGLYCEMIA 09/01/2006  . ABNORMAL RESULT, FUNCTION STUDY, LIVER 09/01/2006    Past Surgical History:  Procedure Laterality Date  . CHOLECYSTECTOMY    . SHOULDER SURGERY    . TOTAL ABDOMINAL HYSTERECTOMY      OB History    No data available       Home Medications    Prior to Admission medications   Medication Sig Start Date End Date Taking? Authorizing Provider  albuterol (PROVENTIL HFA;VENTOLIN HFA) 108 (90 Base) MCG/ACT inhaler Inhale 1 puff into the lungs every 6 (six) hours as needed for wheezing or  shortness of breath.   Yes Historical Provider, MD  atorvastatin (LIPITOR) 40 MG tablet Take 40 mg by mouth daily.   Yes Historical Provider, MD  benazepril-hydrochlorthiazide (LOTENSIN HCT) 5-6.25 MG tablet  11/15/15  Yes Historical Provider, MD  esomeprazole (NEXIUM) 40 MG capsule Take 40 mg by mouth daily before breakfast.   Yes Historical Provider, MD  FLUoxetine (PROZAC) 20 MG capsule Take 20 mg by mouth daily.   Yes Historical Provider, MD  hyoscyamine (ANASPAZ) 0.125 MG TBDP disintergrating tablet Place 0.125 mg under the tongue 3 (three) times daily as needed for cramping.   Yes Historical Provider, MD  levothyroxine (SYNTHROID, LEVOTHROID) 50 MCG tablet  11/15/15  Yes Historical Provider, MD  metoprolol tartrate (LOPRESSOR) 25 MG tablet TAKE 1 TABLET BY MOUTH TWICE A DAY 11/15/15  Yes Historical Provider, MD  aspirin EC 81 MG EC tablet Take 1 tablet (81 mg total) by mouth daily. Patient not taking: Reported on 03/06/2016 05/05/12   Dow Adolph, MD  atorvastatin (LIPITOR) 20 MG tablet Take 1 tablet (20 mg total) by mouth daily. Patient not taking: Reported on 03/06/2016 05/05/12   Dow Adolph, MD    Family History Family History  Problem Relation Age of Onset  . Colon cancer Neg Hx   . Stomach cancer Neg Hx   . Esophageal cancer Neg Hx   . Rectal cancer Neg Hx   . Liver cancer Neg Hx     Social History Social History  Substance Use Topics  . Smoking status: Never Smoker  . Smokeless tobacco: Never Used  . Alcohol use No     Allergies   Patient has no known allergies.   Review of Systems Review of Systems All other systems negative except as documented in the HPI. All pertinent positives and negatives as reviewed in the HPI. Physical Exam Updated Vital Signs BP 181/74   Pulse (!) 54   Temp 98.2 F (36.8 C) (Oral)   Resp 19   Ht 5\' 2"  (1.575 m)   Wt 77.1 kg   SpO2 97%   BMI 31.09 kg/m   Physical Exam  Constitutional: She is oriented to person, place, and  time. She appears well-developed and well-nourished. No distress.  HENT:  Head: Normocephalic and atraumatic.  Mouth/Throat: Oropharynx is clear and moist.  Eyes: Pupils are equal, round, and reactive to light.  Neck: Normal range of motion. Neck supple.  Cardiovascular: Normal rate, regular rhythm and normal heart sounds.  Exam reveals no gallop and no friction rub.   No murmur heard. Pulmonary/Chest: Effort normal and breath sounds normal. No respiratory distress. She has no wheezes.  Abdominal: Soft. Bowel sounds are normal. She exhibits no distension. There is no tenderness. There is no rebound and no guarding.  Neurological: She is alert and oriented to person, place, and time. She exhibits normal muscle tone. Coordination normal.  Skin: Skin is warm and dry. Capillary refill takes less than 2 seconds. No rash noted. No erythema.  Psychiatric: She has a normal mood and affect. Her behavior is normal.  Nursing note and vitals reviewed.    ED Treatments / Results  Labs (all labs ordered are listed, but only abnormal results are displayed) Labs Reviewed  COMPREHENSIVE METABOLIC PANEL - Abnormal; Notable for the following:       Result Value   Sodium 133 (*)    Chloride 98 (*)    Glucose, Bld 104 (*)    All other components within normal limits  CBC WITH DIFFERENTIAL/PLATELET  URINALYSIS, ROUTINE W REFLEX MICROSCOPIC  TROPONIN I  I-STAT CG4 LACTIC ACID, ED    EKG  EKG Interpretation None       Radiology Dg Chest 2 View  Result Date: 03/06/2016 CLINICAL DATA:  Body aches, cough and shortness of breath. EXAM: CHEST  2 VIEW COMPARISON:  12/25/2014 FINDINGS: The heart size and mediastinal contours are within normal limits. Aortic atherosclerosis noted. Both lungs are clear. The visualized skeletal structures are unremarkable. IMPRESSION: No active cardiopulmonary disease. Electronically Signed   By: Signa Kellaylor  Stroud M.D.   On: 03/06/2016 09:30   Ct Angio Chest Pe W Or Wo  Contrast  Result Date: 03/06/2016 CLINICAL DATA:  Shortness of breath. EXAM: CT ANGIOGRAPHY CHEST WITH CONTRAST TECHNIQUE: Multidetector CT imaging of the chest was performed using the standard protocol during bolus administration of intravenous contrast. Multiplanar CT image reconstructions and MIPs were obtained to evaluate the vascular anatomy. CONTRAST:  100 cc Isovue 370 COMPARISON:  Chest CT dated 09/18/2011 and chest x-ray dated 03/06/2016 FINDINGS: Cardiovascular: Satisfactory opacification of the pulmonary arteries to the segmental level. No evidence of pulmonary embolism. Normal heart size. No pericardial effusion. Mediastinum/Nodes: Thymic tissue is slightly more prominent than on the prior study now measuring 11 x 9 x 25 mm, previously 6 x 13 x 25 mm. I do not think this is a significant change. Lungs/Pleura: The lungs are clear. 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 Upper Abdomen: Negative Musculoskeletal: No chest  wall abnormality. No acute or significant osseous findings. Review of the MIP images confirms the above findings. IMPRESSION: No evidence of pulmonary embolism or other significant abnormality. Electronically Signed   By: Francene Boyers M.D.   On: 03/06/2016 13:32   Ct Abdomen Pelvis W Contrast  Result Date: 03/06/2016 CLINICAL DATA:  Abdominal discomfort EXAM: CT ABDOMEN AND PELVIS WITH CONTRAST TECHNIQUE: Multidetector CT imaging of the abdomen and pelvis was performed using the standard protocol following bolus administration of intravenous contrast. CONTRAST:  100 mL Isovue 370 nonionic COMPARISON:  April 27, 2014 FINDINGS: Lower chest: There is mild bibasilar atelectatic change. Hepatobiliary: There is hepatic steatosis. No focal liver lesions are evident. Gallbladder is absent. Common bile duct measures 11 mm, prominent, without mass or calculus demonstrable. Pancreas: No pancreatic mass or inflammatory focus. Spleen: No splenic lesions are appreciable.  Adrenals/Urinary Tract: Adrenals appear normal bilaterally. Kidneys bilaterally show no evidence of mass or hydronephrosis on either side. There is no renal or ureteral calculus on either side. Urinary bladder is midline with wall thickness within normal limits. Stomach/Bowel: There are scattered sigmoid diverticula without diverticulitis. There is no appreciable bowel wall or mesenteric thickening. No bowel obstruction. No free air or portal venous air. Vascular/Lymphatic: There is no abdominal aortic aneurysm. There are scattered foci of atherosclerotic calcification in the aorta. Major mesenteric vessels appear patent. There is no appreciable adenopathy in the abdomen or pelvis. Reproductive: Uterus is absent. There is no pelvic mass or pelvic fluid collection. Other: Appendix appears normal. There is no abscess or ascites in the abdomen or pelvis. Musculoskeletal: There is degenerative change in the lumbar spine. There are no blastic or lytic bone lesions. Calcifications in the buttocks region on the right may represent prior granulomas from injections. Abdominal wall otherwise unremarkable. No intramuscular lesions are evident. IMPRESSION: Scattered sigmoid diverticula without diverticulitis. No bowel obstruction. No abscess. Appendix appears normal. Heights hepatic steatosis. Gallbladder absent. Mild prominence of common bile duct without mass or calculus evident by CT. No renal or ureteral calculus.  No hydronephrosis. Aortic atherosclerosis. Electronically Signed   By: Bretta Bang III M.D.   On: 03/06/2016 13:33    Procedures Procedures (including critical care time)  Medications Ordered in ED Medications  sodium chloride 0.9 % bolus 1,000 mL (0 mLs Intravenous Stopped 03/06/16 1222)  iopamidol (ISOVUE-370) 76 % injection 100 mL ( Intravenous Canceled Entry 03/06/16 1347)     Initial Impression / Assessment and Plan / ED Course  I have reviewed the triage vital signs and the nursing  notes.  Pertinent labs & imaging results that were available during my care of the patient were reviewed by me and considered in my medical decision making (see chart for details).  Clinical Course     Patient is feeling improved following IV fluids.  She is also given something to eat and tolerated this well.  Her CT scan as her chest and abdomen were negative.  She most likely does have some residual influenza-like illness.  Told to follow up with her primary care Dr. told to return here as needed.  Patient and family agree to the plan and all questions were answered  Final Clinical Impressions(s) / ED Diagnoses   Final diagnoses:  None    New Prescriptions New Prescriptions   No medications on file     Charlestine Night, PA-C 03/06/16 1419    Derwood Kaplan, MD 03/06/16 1619

## 2016-03-06 NOTE — ED Notes (Signed)
Patient feels weak and achy all over

## 2016-03-06 NOTE — ED Notes (Signed)
RN to start line and collect blood work 

## 2016-03-06 NOTE — ED Triage Notes (Signed)
Pt has been having generalized body aches, cough, shob, HA, nausea, diarrhea x 2 weeks.  Pt rates pain 8/10.

## 2016-03-06 NOTE — Discharge Instructions (Signed)
Return here as needed. Follow up with her PCP. Increase her fluid intake.

## 2016-08-02 ENCOUNTER — Other Ambulatory Visit: Payer: Self-pay | Admitting: Orthopedic Surgery

## 2016-08-02 DIAGNOSIS — M4726 Other spondylosis with radiculopathy, lumbar region: Secondary | ICD-10-CM

## 2016-08-08 ENCOUNTER — Ambulatory Visit (INDEPENDENT_AMBULATORY_CARE_PROVIDER_SITE_OTHER): Payer: 59 | Admitting: Gastroenterology

## 2016-08-08 ENCOUNTER — Encounter: Payer: Self-pay | Admitting: Gastroenterology

## 2016-08-08 ENCOUNTER — Encounter (INDEPENDENT_AMBULATORY_CARE_PROVIDER_SITE_OTHER): Payer: Self-pay

## 2016-08-08 VITALS — BP 140/80 | HR 60 | Ht 63.0 in | Wt 181.0 lb

## 2016-08-08 DIAGNOSIS — R194 Change in bowel habit: Secondary | ICD-10-CM | POA: Diagnosis not present

## 2016-08-08 DIAGNOSIS — K582 Mixed irritable bowel syndrome: Secondary | ICD-10-CM

## 2016-08-08 DIAGNOSIS — R140 Abdominal distension (gaseous): Secondary | ICD-10-CM

## 2016-08-08 DIAGNOSIS — K219 Gastro-esophageal reflux disease without esophagitis: Secondary | ICD-10-CM

## 2016-08-08 MED ORDER — CEPHALEXIN 250 MG PO CAPS: 250.0000 mg | ORAL_CAPSULE | Freq: Three times a day (TID) | ORAL | 0 refills | Status: AC

## 2016-08-08 MED ORDER — METRONIDAZOLE 250 MG PO TABS: 250.0000 mg | ORAL_TABLET | Freq: Three times a day (TID) | ORAL | 0 refills | Status: AC

## 2016-08-08 NOTE — Patient Instructions (Addendum)
If you are age 78 or older, your body mass index should be between 23-30. Your Body mass index is 32.06 kg/m. If this is out of the aforementioned range listed, please consider follow up with your Primary Care Provider.  If you are age 78 or younger, your body mass index should be between 19-25. Your Body mass index is 32.06 kg/m. If this is out of the aformentioned range listed, please consider follow up with your Primary Care Provider.    Food Guidelines for gas and bloating:  Many people have difficulty digesting certain foods, causing a variety of distressing and embarrassing symptoms such as abdominal pain, bloating and gas.  These foods may need to be avoided or consumed in small amounts.  Here are some tips that might be helpful for you.  1.   Lactose intolerance is the difficulty or complete inability to digest lactose, the natural sugar in milk and anything made from milk.  This condition is harmless, common, and can begin any time during life.  Some people can digest a modest amount of lactose while others cannot tolerate any.  Also, not all dairy products contain equal amounts of lactose.  For example, hard cheeses such as parmesan have less lactose than soft cheeses such as cheddar.  Yogurt has less lactose than milk or cheese.  Many packaged foods (even many brands of bread) have milk, so read ingredient lists carefully.  It is difficult to test for lactose intolerance, so just try avoiding lactose as much as possible for a week and see what happens with your symptoms.  If you seem to be lactose intolerant, the best plan is to avoid it (but make sure you get calcium from another source).  The next best thing is to use lactase enzyme supplements, available over the counter everywhere.  Just know that many lactose intolerant people need to take several tablets with each serving of dairy to avoid symptoms.  Lastly, a lot of restaurant food is made with milk or butter.  Many are things you might  not suspect, such as mashed potatoes, rice and pasta (cooked with butter) and "grilled" items.  If you are lactose intolerant, it never hurts to ask your server what has milk or butter.  2.   Fiber is an important part of your diet, but not all fiber is well-tolerated.  Insoluble fiber such as bran is often consumed by normal gut bacteria and converted into gas.  Soluble fiber such as oats, squash, carrots and green beans are typically tolerated better.  3.   Some types of carbohydrates can be poorly digested.  Examples include: fructose (apples, cherries, pears, raisins and other dried fruits), fructans (onions, zucchini, large amounts of wheat), sorbitol/mannitol/xylitol and sucralose/Splenda (common artificial sweeteners), and raffinose (lentils, broccoli, cabbage, asparagus, brussel sprouts, many types of beans).  Do a Programmer, multimediaweb search for National CityFODMAP diet and you will find helpful information. Beano, a dietary supplement, will often help with raffinose-containing foods.  As with lactase tablets, you may need several per serving.  4.   Whenever possible, avoid processed food&meats and chemical additives.  High fructose corn syrup, a common sweetener, may be difficult to digest.  Eggs and soy (comes from the soybean, and added to many foods now) are the other most common bloating/gassy foods.  - Dr. Sherlynn CarbonHenry Danis [facility]

## 2016-08-08 NOTE — Progress Notes (Signed)
     [facility] Progress Note  Chief Complaint: Abdominal pain, alternating bowel habits and  bloating.  Subjective  History:  This is follow-up for a 76 year old woman who I last saw for consult in November 2017. At that time, she had a multitude of GI symptoms and had previously seen Dr. Jeani HawkingPatrick Hung with an extensive workup. My impression was a probable functional bowel disorder. She had also previously been treated for H. pylori, so I did a stool antigen to confirm eradication. He is here today with her husband and her granddaughter, who translates Spanish for her. She has the same symptoms as before, with abdominal loading and visible distention after meals with alternating constipation and diarrhea. She denies rectal bleeding, she complains of frequent belching and finds the symptoms embarrassing. She feels that she has a chronic cough which may be related to this and also bad breath.  ROS: Cardiovascular:  no chest pain Respiratory: no dyspnea  The patient's Past Medical, Family and Social History were reviewed and are on file in the EMR.  Objective:  Med list reviewed  Vital signs in last 24 hrs: Vitals:   08/08/16 1558  BP: 140/80  Pulse: 60    Physical Exam    HEENT: sclera anicteric, oral mucosa moist without lesions  Neck: supple, no thyromegaly, JVD or lymphadenopathy  Cardiac: RRR without murmurs, S1S2 heard, no peripheral edema  Pulm: clear to auscultation bilaterally, normal RR and effort noted  Abdomen: soft, Obese, mild distractible LUQ tenderness, with active bowel sounds. No guarding or palpable hepatosplenomegaly. Long midline scar reportedly from her cholecystectomy  Skin; warm and dry, no jaundice or rash  Data: Stool H. pylori antigen negative last year  @ASSESSMENTPLANBEGIN @ Assessment: Encounter Diagnoses  Name Primary?  . Abdominal bloating Yes  . Altered bowel habits   . Irritable bowel syndrome with both constipation and  diarrhea   . Gastroesophageal reflux disease without esophagitis    Overall, her symptoms are most consistent with a functional bowel disorder. She is bothered that she cannot eat certain food she would like such as beans and cabbage. I have done my best to explain how these are common triggering foods in this scenario. I given her some written dietary advice that I hope will be helpful.  She does not seem to have typical risk factors for bacterial overgrowth. However, it is possible, so I have elected to empirically treat her with 10 days of Keflex and Flagyl If that is not especially helpful, I do not think I really have anything else to offer for this. Abdominal best to reassure her that there is no evidence of a harmful cause of these symptoms.   Total time 25 minutes, over half spent in counseling and coordination of care.   Charlie PitterHenry L Danis III

## 2016-08-09 ENCOUNTER — Other Ambulatory Visit: Payer: 59

## 2016-08-09 ENCOUNTER — Ambulatory Visit
Admission: RE | Admit: 2016-08-09 | Discharge: 2016-08-09 | Disposition: A | Discharge status: Home / Self Care | Payer: 59 | Source: Ambulatory Visit | Attending: Orthopedic Surgery | Admitting: Orthopedic Surgery

## 2016-08-09 DIAGNOSIS — M4726 Other spondylosis with radiculopathy, lumbar region: Secondary | ICD-10-CM

## 2016-08-14 ENCOUNTER — Other Ambulatory Visit: Payer: Medicare Other

## 2016-10-24 DIAGNOSIS — E1122 Type 2 diabetes mellitus with diabetic chronic kidney disease: Secondary | ICD-10-CM | POA: Insufficient documentation

## 2016-11-16 ENCOUNTER — Telehealth: Payer: Self-pay | Admitting: Cardiovascular Disease

## 2016-11-16 NOTE — Telephone Encounter (Signed)
Received records from [facility] for appointment on 11/29/16 with Dr Tresa Endo.  Records put with Dr Landry Dyke schedule for 11/29/16. lp

## 2016-11-29 ENCOUNTER — Encounter: Payer: Self-pay | Admitting: Cardiovascular Disease

## 2016-11-29 ENCOUNTER — Ambulatory Visit (INDEPENDENT_AMBULATORY_CARE_PROVIDER_SITE_OTHER): Payer: Medicare Other | Admitting: Cardiovascular Disease

## 2016-11-29 VITALS — BP 152/64 | HR 57 | Ht 63.0 in | Wt 176.6 lb

## 2016-11-29 DIAGNOSIS — R011 Cardiac murmur, unspecified: Secondary | ICD-10-CM

## 2016-11-29 DIAGNOSIS — Z01810 Encounter for preprocedural cardiovascular examination: Secondary | ICD-10-CM

## 2016-11-29 DIAGNOSIS — E785 Hyperlipidemia, unspecified: Secondary | ICD-10-CM

## 2016-11-29 DIAGNOSIS — Z8659 Personal history of other mental and behavioral disorders: Secondary | ICD-10-CM

## 2016-11-29 DIAGNOSIS — I5189 Other ill-defined heart diseases: Secondary | ICD-10-CM

## 2016-11-29 DIAGNOSIS — E039 Hypothyroidism, unspecified: Secondary | ICD-10-CM

## 2016-11-29 DIAGNOSIS — I119 Hypertensive heart disease without heart failure: Secondary | ICD-10-CM

## 2016-11-29 DIAGNOSIS — I519 Heart disease, unspecified: Secondary | ICD-10-CM

## 2016-11-29 NOTE — Patient Instructions (Signed)
Medication Instructions:  Your physician recommends that you continue on your current medications as directed. Please refer to the Current Medication list given to you today.  Testing/Procedures: Your physician has requested that you have an echocardiogram (ASAP before the 19th). Echocardiography is a painless test that uses sound waves to create images of your heart. It provides your doctor with information about the size and shape of your heart and how well your heart's chambers and valves are working. This procedure takes approximately one hour. There are no restrictions for this procedure.  This will be done at our [facility] location:  [address]  Follow-Up: Based on results of testing  Any Other Special Instructions Will Be Listed Below (If Applicable).     If you need a refill on your cardiac medications before your next appointment, please call your pharmacy.

## 2016-11-29 NOTE — Progress Notes (Signed)
Echo scheduled and reviewed instructions via Video interpreter ID # [id number]

## 2016-11-29 NOTE — Progress Notes (Signed)
Cardiology Office Note    Date:  12/06/2016   ID:  Necha, Harries 13-Feb-1941, MRN [id number]  PCP:  Kristopher Glee., MD  Cardiologist:  Shelva Majestic, MD   New patient evaluation, referred through [facility] specialist, Dr. Ivan Croft for potential preoperative evaluation  History of Present Illness:  Heidi Preston is a 76 y.o. female is originally from [place] and speaks [place].  She is referred by Dr. Sherlyn Lick for cardiology evaluation due to questionable history of possible prior heart attack while the patient was in [place].  Heidi Preston and her husband presented to the office today.  They speaks Spanish with the husband having limited [place].  I interviewed the patient as best I could, in [place].  Since no interpreter was available.  In addition, I also called the patient's daughter, Heidi Preston [phone])  For subsequent verification.   Heidi Preston [facility]  She has lumbar radiculopathy and had undergone epidural injections.  In the future, the patient may require surgical management due to recurrent symptomatology.  In their report, they state that the patient needs to see a cardiologist because of the previous myocardial infarction that was diagnosed in [place].   Both Mr. and Mrs. Bye denied any history of prior heart attack.  The patient specifically denies any history of chest tightness and she denies any significant exertional shortness of breath.  She had been on a diet and had been trying to lose weight.  She had seen a physician in [place]. Upon further questioning, her husband stated that she was told of possibly having a valve problem.  She was never hospitalized in [place].  The daughter does not recall ever being told that the patient had suffered a heart attack in [place].  She has a history of hypertension and has been on metoprolol 25 mg twice a day.  She also has a history of  levothyroxine at 50 g.  She has a history of hyperlipidemia and had been taking atorvastatin which had been recently increased to 40 mg.  She also has GERD on Nexium and also takes Prozac for depression.  Presently, she denies any chest tightness or pressure.  She denies change in exercise tolerance regarding shortness of breath or chest pain development.  She is unaware of any arrhythmias.  She presents for evaluation.  Past Medical History:  Diagnosis Date  . Anxiety   . Arthritis   . Blepharitis, both eyes   . Esophagitis, reflux   . Fatigue   . Hypercholesteremia   . Hypertension   . Hypothyroid     Past Surgical History:  Procedure Laterality Date  . CHOLECYSTECTOMY    . SHOULDER SURGERY    . TOTAL ABDOMINAL HYSTERECTOMY      Current Medications: Outpatient Medications Prior to Visit  Medication Sig Dispense Refill  . atorvastatin (LIPITOR) 20 MG tablet Take 1 tablet (20 mg total) by mouth daily. 30 tablet 0  . atorvastatin (LIPITOR) 40 MG tablet Take 40 mg by mouth daily.    Marland Kitchen esomeprazole (NEXIUM) 40 MG capsule Take 40 mg by mouth daily before breakfast.    . FLUoxetine (PROZAC) 20 MG capsule Take 20 mg by mouth daily.    Marland Kitchen ibuprofen (ADVIL,MOTRIN) 800 MG tablet Take 1 tablet (800 mg total) by mouth every 8 (eight) hours as needed. 21 tablet 0  . levothyroxine (SYNTHROID, LEVOTHROID) 50 MCG tablet     . metoprolol tartrate (LOPRESSOR) 25  MG tablet TAKE 1 TABLET BY MOUTH TWICE A DAY     No facility-administered medications prior to visit.      Allergies:   Pravastatin   Social History   Social History  . Marital status: Married    Spouse name: N/A  . Number of children: 4  . Years of education: N/A   Occupational History  . housewife    Social History Main Topics  . Smoking status: Never Smoker  . Smokeless tobacco: Never Used  . Alcohol use No  . Drug use: No  . Sexual activity: No   Other Topics Concern  . None   Social History Narrative  . None      She is married for 54 years.  There is no tobacco history.  She only does limited exercise.  Family History:  The patient's family history is not on file.   Parents are deceased.  Her mother died at age 8.  Father died at age 78.  Siblings included 3 brothers, and one sister is deceased and 2 are living.  She has 2 children, ages 71 and 41.  ROS General: Negative; No fevers, chills, or night sweats;  HEENT: Negative; No changes in vision or hearing, sinus congestion, difficulty swallowing Pulmonary: Negative; No cough, wheezing, shortness of breath, hemoptysis Cardiovascular: Negative; No chest pain, presyncope, syncope, palpitations GI: Negative; No nausea, vomiting, diarrhea, or abdominal pain GU: Negative; No dysuria, hematuria, or difficulty voiding Musculoskeletal: Positive for lumbar back pain Hematologic/Oncology: Negative; no easy bruising, bleeding Endocrine: Positive for hypothyroidism Neuro: Negative; no changes in balance, headaches Skin: Negative; No rashes or skin lesions Psychiatric: Positive for depression Sleep: Negative; No snoring, daytime sleepiness, hypersomnolence, bruxism, restless legs, hypnogognic hallucinations, no cataplexy Other comprehensive 14 point system review is negative.   PHYSICAL EXAM:   VS:  BP (!) 152/64   Pulse (!) 57   Ht 5' 3"  (1.6 m)   Wt 176 lb 9.6 oz (80.1 kg)   BMI 31.28 kg/m     Repeat blood pressure by me was 128/80 supine, 134/80 standing  Wt Readings from Last 3 Encounters:  11/29/16 176 lb 9.6 oz (80.1 kg)  08/08/16 181 lb (82.1 kg)  03/06/16 170 lb (77.1 kg)    General: Alert, oriented, no distress.  Skin: normal turgor, no rashes, warm and dry HEENT: Normocephalic, atraumatic. Pupils equal round and reactive to light; sclera anicteric; extraocular muscles intact; Fundi Without hemorrhages, exudates, disc flat. Nose without nasal septal hypertrophy Mouth/Parynx benign; Mallinpatti scale 3 Neck: No JVD, no carotid  bruits; normal carotid upstroke Lungs: clear to ausculatation and percussion; no wheezing or rales Chest wall: without tenderness to palpitation Heart: PMI not displaced, RRR, s1 s2 normal, trivial 1/6 systolic murmur, no diastolic murmur, no rubs, gallops, thrills, or heaves Abdomen: soft, nontender; no hepatosplenomehaly, BS+; abdominal aorta nontender and not dilated by palpation. Back: no CVA tenderness Pulses 2+ Musculoskeletal: full range of motion, normal strength, no joint deformities Extremities: no clubbing cyanosis or edema, Homan's sign negative  Neurologic: grossly nonfocal; Cranial nerves grossly wnl Psychologic: Normal mood and affect   Studies/Labs Reviewed:   EKG:  EKG is ordered today.  ECG (independently read by me): Sinus bradycardia 57 bpm.  LVH by voltage with repolarization changes.  T-wave abnormality anterolaterally.  PR interval 182 ms.  QTc interval 422 ms.  ------------------------------------------------------------------- 12/06/2016 Echo Study Conclusions  - Left ventricle: The cavity size was normal. Wall thickness was   increased in a pattern of moderate  LVH. Systolic function was   normal. The estimated ejection fraction was in the range of 60%   to 65%. Wall motion was normal; there were no regional wall   motion abnormalities. Doppler parameters are consistent with   abnormal left ventricular relaxation (grade 1 diastolic   dysfunction). Doppler parameters are consistent with high   ventricular filling pressure. - Aortic valve: Transvalvular velocity was within the normal range.   There was no stenosis. There was no regurgitation. - Mitral valve: Transvalvular velocity was within the normal range.   There was no evidence for stenosis. There was no regurgitation. - Right ventricle: The cavity size was normal. Wall thickness was   normal. Systolic function was normal. - Tricuspid valve: There was no regurgitation.    Recent Labs: BMP Latest Ref  Rng & Units 03/06/2016 09/29/2012 05/05/2012  Glucose 65 - 99 mg/dL 104(H) 93 100(H)  BUN 6 - 20 mg/dL 12 7 18   Creatinine 0.44 - 1.00 mg/dL 0.75 0.64 0.71  Sodium 135 - 145 mmol/L 133(L) 134(L) 133(L)  Potassium 3.5 - 5.1 mmol/L 4.4 3.5 4.1  Chloride 101 - 111 mmol/L 98(L) 98 97  CO2 22 - 32 mmol/L 27 22 22   Calcium 8.9 - 10.3 mg/dL 9.3 9.3 9.6     Hepatic Function Latest Ref Rng & Units 03/06/2016 09/29/2012 05/12/2011  Total Protein 6.5 - 8.1 g/dL 7.2 6.9 7.2  Albumin 3.5 - 5.0 g/dL 3.9 3.4(L) 3.7  AST 15 - 41 U/L 27 21 37  ALT 14 - 54 U/L 21 17 47(H)  Alk Phosphatase 38 - 126 U/L 59 56 59  Total Bilirubin 0.3 - 1.2 mg/dL 0.8 0.2(L) 0.4  Bilirubin, Direct 0.0 - 0.3 mg/dL - - <0.1    CBC Latest Ref Rng & Units 03/06/2016 09/29/2012 05/04/2012  WBC 4.0 - 10.5 K/uL 8.7 7.3 8.9  Hemoglobin 12.0 - 15.0 g/dL 13.0 12.0 12.7  Hematocrit 36.0 - 46.0 % 38.1 35.0(L) 36.3  Platelets 150 - 400 K/uL 313 300 277   Lab Results  Component Value Date   MCV 90.5 03/06/2016   MCV 92.3 09/29/2012   MCV 88.5 05/04/2012   Lab Results  Component Value Date   TSH 6.138 (H) 05/04/2012   Lab Results  Component Value Date   HGBA1C 6.1 (H) 05/04/2012     BNP No results found for: BNP  ProBNP No results found for: PROBNP   Lipid Panel     Component Value Date/Time   CHOL 198 05/05/2012 0510   TRIG 140 05/05/2012 0510   HDL 43 05/05/2012 0510   CHOLHDL 4.6 05/05/2012 0510   VLDL 28 05/05/2012 0510   LDLCALC 127 (H) 05/05/2012 0510     RADIOLOGY: No results found.   Additional studies/ records that were reviewed today include:  I reviewed the records from [facility].  I reviewed the echo Doppler study and deferred writing this office note until the echo was completed on 12/06/2016.  I personally called the daughter, Heidi Preston and review the echo Doppler data with her in detail and verified my history with her.    ASSESSMENT:    1. Murmur   2. Pre-operative  cardiovascular examination   3. Hypertensive heart disease without heart failure   4. Grade I diastolic dysfunction   5. Hypothyroidism, unspecified type   6. History of depression   7. Hyperlipidemia, unspecified hyperlipidemia type     PLAN:  Heidi Preston is a very pleasant 76 year old female who  was originally from [place], [place].  She has a history of hypertension for which she has been on metoprolol 25 mg twice a day.  The patient has a history of hypothyroidism on levothyroxine replacement, mild depression for which she takes Prozac, and GERD for which she takes Nexium.  She also has a history of hyperlipidemia and currently is on atorvastatin 40 mg and doing well.  The patient denies any definitive cardiac history.  In the report sent by her referring physicians.  There was mention that the patient may have had a heart attack while in [place].  Both the patient and her husband states that she does not have chest pain and she did not have a heart attack.  The patient's daughter also was not aware of the patient having had a heart attack in [place].  I had recommended the patient undergo a 2-D echo Doppler study to assess systolic and diastolic function as well as valvular architecture.  Her echo Doppler study is noted above and is essentially normal with the exception of moderate left ventricular hypertrophy and grade 1 diastolic dysfunction.  This undoubtedly is related to her history of hypertension.  Doppler parameters were also consistent with high ventricular filling pressure.  She does not have any wall motion abnormalities.  Her ECG today in the office shows mild sinus bradycardia contributed by her metoprolol with LVH and probable repolarization T-wave abnormalities.  Based on the patient's clinical history, no chest pain, and normal LV function without wall motion abnormalities, I do not feel that the patient suffered a prior cardiac event.  She is stable from a cardiovascular  standpoint iffuture surgery is necessary.  She denies any chest pain or exertional dyspnea and for this reason , along with her echocardiographic results do not feel that preoperative exercise nuclear imaging .  She is on atorvastatin and her lipids should be reassessed if this has not been done recently by her primary physician.   Medication Adjustments/Labs and Tests Ordered: Current medicines are reviewed at length with the patient today.  Concerns regarding medicines are outlined above.  Medication changes, Labs and Tests ordered today are listed in the Patient Instructions below. Patient Instructions  Medication Instructions:  Your physician recommends that you continue on your current medications as directed. Please refer to the Current Medication list given to you today.  Testing/Procedures: Your physician has requested that you have an echocardiogram (ASAP before the 19th). Echocardiography is a painless test that uses sound waves to create images of your heart. It provides your doctor with information about the size and shape of your heart and how well your heart's chambers and valves are working. This procedure takes approximately one hour. There are no restrictions for this procedure.  This will be done at our [facility] location:  [place]: Based on results of testing  Any Other Special Instructions Will Be Listed Below (If Applicable).     If you need a refill on your cardiac medications before your next appointment, please call your pharmacy.      Signed, Shelva Majestic, MD  12/06/2016 5:55 PM    [facility] [address] [place], [place], [place]  [address] Phone: [phone]

## 2016-12-06 ENCOUNTER — Ambulatory Visit (HOSPITAL_COMMUNITY): Payer: 59 | Attending: Cardiovascular Disease

## 2016-12-06 ENCOUNTER — Other Ambulatory Visit: Payer: Self-pay

## 2016-12-06 DIAGNOSIS — R011 Cardiac murmur, unspecified: Secondary | ICD-10-CM | POA: Diagnosis present

## 2016-12-06 DIAGNOSIS — Z01810 Encounter for preprocedural cardiovascular examination: Secondary | ICD-10-CM | POA: Diagnosis not present

## 2016-12-06 DIAGNOSIS — E039 Hypothyroidism, unspecified: Secondary | ICD-10-CM | POA: Insufficient documentation

## 2016-12-06 DIAGNOSIS — I10 Essential (primary) hypertension: Secondary | ICD-10-CM | POA: Diagnosis not present

## 2016-12-06 DIAGNOSIS — E785 Hyperlipidemia, unspecified: Secondary | ICD-10-CM | POA: Diagnosis not present

## 2017-03-06 ENCOUNTER — Ambulatory Visit (INDEPENDENT_AMBULATORY_CARE_PROVIDER_SITE_OTHER): Payer: 59 | Admitting: Emergency Medicine

## 2017-03-06 ENCOUNTER — Encounter: Payer: Self-pay | Admitting: Emergency Medicine

## 2017-03-06 VITALS — BP 126/76 | HR 69 | Temp 97.9°F | Resp 16 | Ht 63.5 in | Wt 169.2 lb

## 2017-03-06 DIAGNOSIS — E782 Mixed hyperlipidemia: Secondary | ICD-10-CM | POA: Diagnosis not present

## 2017-03-06 DIAGNOSIS — I10 Essential (primary) hypertension: Secondary | ICD-10-CM

## 2017-03-06 DIAGNOSIS — Z8639 Personal history of other endocrine, nutritional and metabolic disease: Secondary | ICD-10-CM | POA: Insufficient documentation

## 2017-03-06 DIAGNOSIS — K219 Gastro-esophageal reflux disease without esophagitis: Secondary | ICD-10-CM

## 2017-03-06 DIAGNOSIS — Z7689 Persons encountering health services in other specified circumstances: Secondary | ICD-10-CM | POA: Diagnosis not present

## 2017-03-06 DIAGNOSIS — G4700 Insomnia, unspecified: Secondary | ICD-10-CM | POA: Diagnosis not present

## 2017-03-06 DIAGNOSIS — Z23 Encounter for immunization: Secondary | ICD-10-CM

## 2017-03-06 DIAGNOSIS — Z8659 Personal history of other mental and behavioral disorders: Secondary | ICD-10-CM | POA: Diagnosis not present

## 2017-03-06 LAB — POCT GLYCOSYLATED HEMOGLOBIN (HGB A1C): HEMOGLOBIN A1C: 6.1

## 2017-03-06 NOTE — Patient Instructions (Addendum)
   IF you received an x-ray today, you will receive an invoice from [facility]. Please contact [facility] at [phone] with questions or concerns regarding your invoice.   IF you received labwork today, you will receive an invoice from [facility]. Please contact [facility] at [phone] with questions or concerns regarding your invoice.   Our billing staff will not be able to assist you with questions regarding bills from these companies.  You will be contacted with the lab results as soon as they are available. The fastest way to get your results is to activate your My Chart account. Instructions are located on the last page of this paperwork. If you have not heard from us regarding the results in 2 weeks, please contact this office.    Health Maintenance, Female Adopting a healthy lifestyle and getting preventive care can go a long way to promote health and wellness. Talk with your health care provider about what schedule of regular examinations is right for you. This is a good chance for you to check in with your provider about disease prevention and staying healthy. In between checkups, there are plenty of things you can do on your own. Experts have done a lot of research about which lifestyle changes and preventive measures are most likely to keep you healthy. Ask your health care provider for more information. Weight and diet Eat a healthy diet  Be sure to include plenty of vegetables, fruits, low-fat dairy products, and lean protein.  Do not eat a lot of foods high in solid fats, added sugars, or salt.  Get regular exercise. This is one of the most important things you can do for your health. ? Most adults should exercise for at least 150 minutes each week. The exercise should increase your heart rate and make you sweat (moderate-intensity exercise). ? Most adults should also do strengthening exercises at least twice a week. This is in addition to the  moderate-intensity exercise.  Maintain a healthy weight  Body mass index (BMI) is a measurement that can be used to identify possible weight problems. It estimates body fat based on height and weight. Your health care provider can help determine your BMI and help you achieve or maintain a healthy weight.  For females 20 years of age and older: ? A BMI below 18.5 is considered underweight. ? A BMI of 18.5 to 24.9 is normal. ? A BMI of 25 to 29.9 is considered overweight. ? A BMI of 30 and above is considered obese.  Watch levels of cholesterol and blood lipids  You should start having your blood tested for lipids and cholesterol at 79 years of age, then have this test every 5 years.  You may need to have your cholesterol levels checked more often if: ? Your lipid or cholesterol levels are high. ? You are older than 79 years of age. ? You are at high risk for heart disease.  Cancer screening Lung Cancer  Lung cancer screening is recommended for adults 55-80 years old who are at high risk for lung cancer because of a history of smoking.  A yearly low-dose CT scan of the lungs is recommended for people who: ? Currently smoke. ? Have quit within the past 15 years. ? Have at least a 30-pack-year history of smoking. A pack year is smoking an average of one pack of cigarettes a day for 1 year.  Yearly screening should continue until it has been 15 years since you quit.  Yearly screening   should stop if you develop a health problem that would prevent you from having lung cancer treatment.  Breast Cancer  Practice breast self-awareness. This means understanding how your breasts normally appear and feel.  It also means doing regular breast self-exams. Let your health care provider know about any changes, no matter how small.  If you are in your 20s or 30s, you should have a clinical breast exam (CBE) by a health care provider every 1-3 years as part of a regular health exam.  If you  are 42 or older, have a CBE every year. Also consider having a breast X-ray (mammogram) every year.  If you have a family history of breast cancer, talk to your health care provider about genetic screening.  If you are at high risk for breast cancer, talk to your health care provider about having an MRI and a mammogram every year.  Breast cancer gene (BRCA) assessment is recommended for women who have family members with BRCA-related cancers. BRCA-related cancers include: ? Breast. ? Ovarian. ? Tubal. ? Peritoneal cancers.  Results of the assessment will determine the need for genetic counseling and BRCA1 and BRCA2 testing.  Cervical Cancer Your health care provider may recommend that you be screened regularly for cancer of the pelvic organs (ovaries, uterus, and vagina). This screening involves a pelvic examination, including checking for microscopic changes to the surface of your cervix (Pap test). You may be encouraged to have this screening done every 3 years, beginning at age 56.  For women ages 41-65, health care providers may recommend pelvic exams and Pap testing every 3 years, or they may recommend the Pap and pelvic exam, combined with testing for human papilloma virus (HPV), every 5 years. Some types of HPV increase your risk of cervical cancer. Testing for HPV may also be done on women of any age with unclear Pap test results.  Other health care providers may not recommend any screening for nonpregnant women who are considered low risk for pelvic cancer and who do not have symptoms. Ask your health care provider if a screening pelvic exam is right for you.  If you have had past treatment for cervical cancer or a condition that could lead to cancer, you need Pap tests and screening for cancer for at least 20 years after your treatment. If Pap tests have been discontinued, your risk factors (such as having a new sexual partner) need to be reassessed to determine if screening should  resume. Some women have medical problems that increase the chance of getting cervical cancer. In these cases, your health care provider may recommend more frequent screening and Pap tests.  Colorectal Cancer  This type of cancer can be detected and often prevented.  Routine colorectal cancer screening usually begins at 79 years of age and continues through 79 years of age.  Your health care provider may recommend screening at an earlier age if you have risk factors for colon cancer.  Your health care provider may also recommend using home test kits to check for hidden blood in the stool.  A small camera at the end of a tube can be used to examine your colon directly (sigmoidoscopy or colonoscopy). This is done to check for the earliest forms of colorectal cancer.  Routine screening usually begins at age 16.  Direct examination of the colon should be repeated every 5-10 years through 79 years of age. However, you may need to be screened more often if early forms of precancerous polyps or  small growths are found.  Skin Cancer  Check your skin from head to toe regularly.  Tell your health care provider about any new moles or changes in moles, especially if there is a change in a mole's shape or color.  Also tell your health care provider if you have a mole that is larger than the size of a pencil eraser.  Always use sunscreen. Apply sunscreen liberally and repeatedly throughout the day.  Protect yourself by wearing long sleeves, pants, a wide-brimmed hat, and sunglasses whenever you are outside.  Heart disease, diabetes, and high blood pressure  High blood pressure causes heart disease and increases the risk of stroke. High blood pressure is more likely to develop in: ? People who have blood pressure in the high end of the normal range (130-139/85-89 mm Hg). ? People who are overweight or obese. ? People who are African American.  If you are 61-36 years of age, have your blood  pressure checked every 3-5 years. If you are 6 years of age or older, have your blood pressure checked every year. You should have your blood pressure measured twice-once when you are at a hospital or clinic, and once when you are not at a hospital or clinic. Record the average of the two measurements. To check your blood pressure when you are not at a hospital or clinic, you can use: ? An automated blood pressure machine at a pharmacy. ? A home blood pressure monitor.  If you are between 7 years and 29 years old, ask your health care provider if you should take aspirin to prevent strokes.  Have regular diabetes screenings. This involves taking a blood sample to check your fasting blood sugar level. ? If you are at a normal weight and have a low risk for diabetes, have this test once every three years after 79 years of age. ? If you are overweight and have a high risk for diabetes, consider being tested at a younger age or more often. Preventing infection Hepatitis B  If you have a higher risk for hepatitis B, you should be screened for this virus. You are considered at high risk for hepatitis B if: ? You were born in a country where hepatitis B is common. Ask your health care provider which countries are considered high risk. ? Your parents were born in a high-risk country, and you have not been immunized against hepatitis B (hepatitis B vaccine). ? You have HIV or AIDS. ? You use needles to inject street drugs. ? You live with someone who has hepatitis B. ? You have had sex with someone who has hepatitis B. ? You get hemodialysis treatment. ? You take certain medicines for conditions, including cancer, organ transplantation, and autoimmune conditions.  Hepatitis C  Blood testing is recommended for: ? Everyone born from [birth year] through 1967. ? Anyone with known risk factors for hepatitis C.  Sexually transmitted infections (STIs)  You should be screened for sexually transmitted  infections (STIs) including gonorrhea and chlamydia if: ? You are sexually active and are younger than 78 years of age. ? You are older than 79 years of age and your health care provider tells you that you are at risk for this type of infection. ? Your sexual activity has changed since you were last screened and you are at an increased risk for chlamydia or gonorrhea. Ask your health care provider if you are at risk.  If you do not have HIV, but are at risk, it  may be recommended that you take a prescription medicine daily to prevent HIV infection. This is called pre-exposure prophylaxis (PrEP). You are considered at risk if: ? You are sexually active and do not regularly use condoms or know the HIV status of your partner(s). ? You take drugs by injection. ? You are sexually active with a partner who has HIV.  Talk with your health care provider about whether you are at high risk of being infected with HIV. If you choose to begin PrEP, you should first be tested for HIV. You should then be tested every 3 months for as long as you are taking PrEP. Pregnancy  If you are premenopausal and you may become pregnant, ask your health care provider about preconception counseling.  If you may become pregnant, take 400 to 800 micrograms (mcg) of folic acid every day.  If you want to prevent pregnancy, talk to your health care provider about birth control (contraception). Osteoporosis and menopause  Osteoporosis is a disease in which the bones lose minerals and strength with aging. This can result in serious bone fractures. Your risk for osteoporosis can be identified using a bone density scan.  If you are 97 years of age or older, or if you are at risk for osteoporosis and fractures, ask your health care provider if you should be screened.  Ask your health care provider whether you should take a calcium or vitamin D supplement to lower your risk for osteoporosis.  Menopause may have certain physical  symptoms and risks.  Hormone replacement therapy may reduce some of these symptoms and risks. Talk to your health care provider about whether hormone replacement therapy is right for you. Follow these instructions at home:  Schedule regular health, dental, and eye exams.  Stay current with your immunizations.  Do not use any tobacco products including cigarettes, chewing tobacco, or electronic cigarettes.  If you are pregnant, do not drink alcohol.  If you are breastfeeding, limit how much and how often you drink alcohol.  Limit alcohol intake to no more than 1 drink per day for nonpregnant women. One drink equals 12 ounces of beer, 5 ounces of wine, or 1 ounces of hard liquor.  Do not use street drugs.  Do not share needles.  Ask your health care provider for help if you need support or information about quitting drugs.  Tell your health care provider if you often feel depressed.  Tell your health care provider if you have ever been abused or do not feel safe at home. This information is not intended to replace advice given to you by your health care provider. Make sure you discuss any questions you have with your health care provider. Document Released: 08/22/2010 Document Revised: 07/15/2015 Document Reviewed: 11/10/2014 [employer]vier Interactive Patient Education  [employer].

## 2017-03-06 NOTE — Progress Notes (Signed)
Heidi Preston 77 y.o.   Chief Complaint  Patient presents with  . Establish Care    per patient no concerns today    HISTORY OF PRESENT ILLNESS: This is a 77 y.o. female here to establish care; multiple medical problems; no complaints.  HPI   Prior to Admission medications   Medication Sig Start Date End Date Taking? Authorizing Provider  atorvastatin (LIPITOR) 40 MG tablet Take 40 mg by mouth daily.   Yes [provider]  esomeprazole (NEXIUM) 40 MG capsule Take 40 mg by mouth daily before breakfast.   Yes [provider]  FLUoxetine (PROZAC) 20 MG capsule Take 20 mg by mouth daily.   Yes [provider]  ibuprofen (ADVIL,MOTRIN) 800 MG tablet Take 1 tablet (800 mg total) by mouth every 8 (eight) hours as needed. 03/06/16  Yes Lawyer, Harrell Gave, PA-C  levothyroxine (SYNTHROID, LEVOTHROID) 50 MCG tablet  11/15/15  Yes [provider]  metoprolol tartrate (LOPRESSOR) 25 MG tablet TAKE 1 TABLET BY MOUTH TWICE A DAY 11/15/15  Yes [provider]  atorvastatin (LIPITOR) 20 MG tablet Take 1 tablet (20 mg total) by mouth daily. Patient not taking: Reported on 03/06/2017 05/05/12   Heidi Avers, MD    Allergies  Allergen Reactions  . Pravastatin Other (See Comments)    Headache    Patient Active Problem List   Diagnosis Date Noted  . Chest pain 05/04/2012  . Urinary incontinence 05/04/2012  . Pulmonary nodule 05/25/2011  . Chronic cough 10/05/2009  . KNEE PAIN, LEFT 05/19/2009  . INSOMNIA 03/08/2009  . HYPERTENSION, BENIGN ESSENTIAL 02/17/2009  . PAIN IN JOINT, MULTIPLE SITES 02/17/2009  . OSTEOARTHRITIS 11/23/2008  . POSTHERPETIC NEURALGIA 09/04/2008  . 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 UTERINE PROLAPSE 01/01/2008  . HYPOTHYROIDISM 09/01/2006  . 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 OBESITY NOS 09/01/2006  . DEPRESSION 09/01/2006  . GASTROESOPHAGEAL REFLUX DISEASE 09/01/2006  . VAGINITIS, ATROPHIC 09/01/2006  . 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 Headache(784.0) 09/01/2006  . HYPERGLYCEMIA 09/01/2006  . ABNORMAL RESULT, FUNCTION STUDY, LIVER 09/01/2006    Past Medical History:  Diagnosis Date  . Anxiety   . Arthritis   . Blepharitis, both eyes   . Esophagitis, reflux   . Fatigue   . Hypercholesteremia   . Hypertension   . Hypothyroid     Past Surgical History:  Procedure Laterality Date  . CHOLECYSTECTOMY    . SHOULDER SURGERY    . TOTAL ABDOMINAL HYSTERECTOMY      Social History   Socioeconomic History  . Marital status: Married    Spouse name: Not on file  . Number of children: 4  . Years of education: Not on file  . Highest education level: Not on file  Social Needs  . Financial resource strain: Not on file  . Food insecurity - worry: Not on file  . Food insecurity - inability: Not on file  . Transportation needs - medical: Not on file  . Transportation needs - non-medical: Not on file  Occupational History  . Occupation: housewife  Tobacco Use  . Smoking status: Never Smoker  . Smokeless tobacco: Never Used  Substance and Sexual Activity  . Alcohol use: No  . Drug use: No  . Sexual activity: No  Other Topics Concern  . Not on file  Social History Narrative  . Not on file    Family History  Problem Relation Age of Onset  . Colon cancer Neg Hx   . Stomach cancer Neg  Hx   . Esophageal cancer Neg Hx   . Rectal cancer Neg Hx   . Liver cancer Neg Hx      Review of Systems  Constitutional: Negative.  Negative for chills, fever, malaise/fatigue and weight loss.  HENT: Negative.  Negative for congestion, ear discharge, nosebleeds, sinus pain and sore throat.   Eyes: Negative.  Negative for blurred vision and double vision.  Respiratory: Negative.  Negative for cough, hemoptysis, shortness of breath and wheezing.   Cardiovascular: Negative.  Negative for chest pain, palpitations, claudication and leg swelling.  Gastrointestinal: Negative.  Negative for abdominal pain,  blood in stool, diarrhea, nausea and vomiting.  Genitourinary: Negative.  Negative for dysuria and hematuria.  Musculoskeletal: Negative.  Negative for back pain, myalgias and neck pain.  Skin: Negative.  Negative for rash.  Neurological: Negative.  Negative for dizziness and headaches.  Endo/Heme/Allergies: Negative.   All other systems reviewed and are negative.  Vitals:   03/06/17 0918  BP: 126/76  Pulse: 69  Resp: 16  Temp: 97.9 F (36.6 C)  SpO2: 97%     Physical Exam  Constitutional: She is oriented to person, place, and time. She appears well-developed and well-nourished.  HENT:  Head: Normocephalic and atraumatic.  Right Ear: External ear normal.  Left Ear: External ear normal.  Nose: Nose normal.  Mouth/Throat: Oropharynx is clear and moist.  Eyes: Conjunctivae and EOM are normal. Pupils are equal, round, and reactive to light.  Neck: Normal range of motion. Neck supple. No JVD present. No thyromegaly present.  Cardiovascular: Normal rate, regular rhythm, normal heart sounds and intact distal pulses.  Pulmonary/Chest: Effort normal and breath sounds normal. No respiratory distress.  Abdominal: Soft. Bowel sounds are normal. She exhibits no distension. There is no tenderness.  Lymphadenopathy:    She has no cervical adenopathy.  Neurological: She is alert and oriented to person, place, and time. No sensory deficit. She exhibits normal muscle tone.  Skin: Skin is warm and dry. Capillary refill takes less than 2 seconds. No rash noted.  Psychiatric: She has a normal mood and affect. Her behavior is normal.  Vitals reviewed.   A total of 30 minutes was spent in the room with the patient, greater than 50% of which was in counseling/coordination of care.  ASSESSMENT & PLAN: Heidi Preston was seen today for establish care.  Diagnoses and all orders for this visit:  HYPERTENSION, BENIGN ESSENTIAL -     CBC with Differential/Platelet -     Comprehensive metabolic  panel  Need for prophylactic vaccination and inoculation against influenza -     Flu Vaccine QUAD 36+ mos IM  Insomnia, unspecified type  HYPERLIPIDEMIA, MIXED -     Lipid panel  Gastroesophageal reflux disease without esophagitis  History of depression  History of hypothyroidism -     TSH  History of hyperglycemia -     POCT glycosylated hemoglobin (Hb A1C)  Encounter to establish care   Results for orders placed or performed in visit on 03/06/17 (from the past 24 hour(s))  POCT glycosylated hemoglobin (Hb A1C)     Status: None   Collection Time: 03/06/17 10:27 AM  Result Value Ref Range   Hemoglobin A1C 6.1    Patient Instructions       IF you received an x-ray today, you will receive an invoice from [facility]. Please contact [facility] at [phone] with questions or concerns regarding your invoice.   IF you received labwork today, you will receive an  invoice from [place]. Please contact [facility] at [phone] with questions or concerns regarding your invoice.   Our billing staff will not be able to assist you with questions regarding bills from these companies.  You will be contacted with the lab results as soon as they are available. The fastest way to get your results is to activate your My Chart account. Instructions are located on the last page of this paperwork. If you have not heard from [place] regarding the results in 2 weeks, please contact this office.    Health Maintenance, Female Adopting a healthy lifestyle and getting preventive care can go a long way to promote health and wellness. Talk with your health care provider about what schedule of regular examinations is right for you. This is a good chance for you to check in with your provider about disease prevention and staying healthy. In between checkups, there are plenty of things you can do on your own. Experts have done a lot of research about which lifestyle changes and  preventive measures are most likely to keep you healthy. Ask your health care provider for more information. Weight and diet Eat a healthy diet  Be sure to include plenty of vegetables, fruits, low-fat dairy products, and lean protein.  Do not eat a lot of foods high in solid fats, added sugars, or salt.  Get regular exercise. This is one of the most important things you can do for your health. ? Most adults should exercise for at least 150 minutes each week. The exercise should increase your heart rate and make you sweat (moderate-intensity exercise). ? Most adults should also do strengthening exercises at least twice a week. This is in addition to the moderate-intensity exercise.  Maintain a healthy weight  Body mass index (BMI) is a measurement that can be used to identify possible weight problems. It estimates body fat based on height and weight. Your health care provider can help determine your BMI and help you achieve or maintain a healthy weight.  For females 83 years of age and older: ? A BMI below 18.5 is considered underweight. ? A BMI of 18.5 to 24.9 is normal. ? A BMI of 25 to 29.9 is considered overweight. ? A BMI of 30 and above is considered obese.  Watch levels of cholesterol and blood lipids  You should start having your blood tested for lipids and cholesterol at 79 years of age, then have this test every 5 years.  You may need to have your cholesterol levels checked more often if: ? Your lipid or cholesterol levels are high. ? You are older than 79 years of age. ? You are at high risk for heart disease.  Cancer screening Lung Cancer  Lung cancer screening is recommended for adults 4-78 years old who are at high risk for lung cancer because of a history of smoking.  A yearly low-dose CT scan of the lungs is recommended for people who: ? Currently smoke. ? Have quit within the past 15 years. ? Have at least a 30-pack-year history of smoking. A pack year is  smoking an average of one pack of cigarettes a day for 1 year.  Yearly screening should continue until it has been 15 years since you quit.  Yearly screening should stop if you develop a health problem that would prevent you from having lung cancer treatment.  Breast Cancer  Practice breast self-awareness. This means understanding how your breasts normally appear and feel.  It also means doing regular  breast self-exams. Let your health care provider know about any changes, no matter how small.  If you are in your 20s or 30s, you should have a clinical breast exam (CBE) by a health care provider every 1-3 years as part of a regular health exam.  If you are 78 or older, have a CBE every year. Also consider having a breast X-ray (mammogram) every year.  If you have a family history of breast cancer, talk to your health care provider about genetic screening.  If you are at high risk for breast cancer, talk to your health care provider about having an MRI and a mammogram every year.  Breast cancer gene (BRCA) assessment is recommended for women who have family members with BRCA-related cancers. BRCA-related cancers include: ? Breast. ? Ovarian. ? Tubal. ? Peritoneal cancers.  Results of the assessment will determine the need for genetic counseling and BRCA1 and BRCA2 testing.  Cervical Cancer Your health care provider may recommend that you be screened regularly for cancer of the pelvic organs (ovaries, uterus, and vagina). This screening involves a pelvic examination, including checking for microscopic changes to the surface of your cervix (Pap test). You may be encouraged to have this screening done every 3 years, beginning at age 80.  For women ages 81-65, health care providers may recommend pelvic exams and Pap testing every 3 years, or they may recommend the Pap and pelvic exam, combined with testing for human papilloma virus (HPV), every 5 years. Some types of HPV increase your risk  of cervical cancer. Testing for HPV may also be done on women of any age with unclear Pap test results.  Other health care providers may not recommend any screening for nonpregnant women who are considered low risk for pelvic cancer and who do not have symptoms. Ask your health care provider if a screening pelvic exam is right for you.  If you have had past treatment for cervical cancer or a condition that could lead to cancer, you need Pap tests and screening for cancer for at least 20 years after your treatment. If Pap tests have been discontinued, your risk factors (such as having a new sexual partner) need to be reassessed to determine if screening should resume. Some women have medical problems that increase the chance of getting cervical cancer. In these cases, your health care provider may recommend more frequent screening and Pap tests.  Colorectal Cancer  This type of cancer can be detected and often prevented.  Routine colorectal cancer screening usually begins at 79 years of age and continues through 79 years of age.  Your health care provider may recommend screening at an earlier age if you have risk factors for colon cancer.  Your health care provider may also recommend using home test kits to check for hidden blood in the stool.  A small camera at the end of a tube can be used to examine your colon directly (sigmoidoscopy or colonoscopy). This is done to check for the earliest forms of colorectal cancer.  Routine screening usually begins at age 54.  Direct examination of the colon should be repeated every 5-10 years through 79 years of age. However, you may need to be screened more often if early forms of precancerous polyps or small growths are found.  Skin Cancer  Check your skin from head to toe regularly.  Tell your health care provider about any new moles or changes in moles, especially if there is a change in a mole's shape or  color.  Also tell your health care  provider if you have a mole that is larger than the size of a pencil eraser.  Always use sunscreen. Apply sunscreen liberally and repeatedly throughout the day.  Protect yourself by wearing long sleeves, pants, a wide-brimmed hat, and sunglasses whenever you are outside.  Heart disease, diabetes, and high blood pressure  High blood pressure causes heart disease and increases the risk of stroke. High blood pressure is more likely to develop in: ? People who have blood pressure in the high end of the normal range (130-139/85-89 mm Hg). ? People who are overweight or obese. ? People who are African American.  If you are 64-60 years of age, have your blood pressure checked every 3-5 years. If you are 59 years of age or older, have your blood pressure checked every year. You should have your blood pressure measured twice-once when you are at a hospital or clinic, and once when you are not at a hospital or clinic. Record the average of the two measurements. To check your blood pressure when you are not at a hospital or clinic, you can use: ? An automated blood pressure machine at a pharmacy. ? A home blood pressure monitor.  If you are between 82 years and 58 years old, ask your health care provider if you should take aspirin to prevent strokes.  Have regular diabetes screenings. This involves taking a blood sample to check your fasting blood sugar level. ? If you are at a normal weight and have a low risk for diabetes, have this test once every three years after 79 years of age. ? If you are overweight and have a high risk for diabetes, consider being tested at a younger age or more often. Preventing infection Hepatitis B  If you have a higher risk for hepatitis B, you should be screened for this virus. You are considered at high risk for hepatitis B if: ? You were born in a country where hepatitis B is common. Ask your health care provider which countries are considered high risk. ? Your  parents were born in a high-risk country, and you have not been immunized against hepatitis B (hepatitis B vaccine). ? You have HIV or AIDS. ? You use needles to inject street drugs. ? You live with someone who has hepatitis B. ? You have had sex with someone who has hepatitis B. ? You get hemodialysis treatment. ? You take certain medicines for conditions, including cancer, organ transplantation, and autoimmune conditions.  Hepatitis C  Blood testing is recommended for: ? Everyone born from [birth year] through 1967. ? Anyone with known risk factors for hepatitis C.  Sexually transmitted infections (STIs)  You should be screened for sexually transmitted infections (STIs) including gonorrhea and chlamydia if: ? You are sexually active and are younger than 79 years of age. ? You are older than 79 years of age and your health care provider tells you that you are at risk for this type of infection. ? Your sexual activity has changed since you were last screened and you are at an increased risk for chlamydia or gonorrhea. Ask your health care provider if you are at risk.  If you do not have HIV, but are at risk, it may be recommended that you take a prescription medicine daily to prevent HIV infection. This is called pre-exposure prophylaxis (PrEP). You are considered at risk if: ? You are sexually active and do not regularly use condoms or know the HIV  status of your partner(s). ? You take drugs by injection. ? You are sexually active with a partner who has HIV.  Talk with your health care provider about whether you are at high risk of being infected with HIV. If you choose to begin PrEP, you should first be tested for HIV. You should then be tested every 3 months for as long as you are taking PrEP. Pregnancy  If you are premenopausal and you may become pregnant, ask your health care provider about preconception counseling.  If you may become pregnant, take 400 to 800 micrograms (mcg) of folic  acid every day.  If you want to prevent pregnancy, talk to your health care provider about birth control (contraception). Osteoporosis and menopause  Osteoporosis is a disease in which the bones lose minerals and strength with aging. This can result in serious bone fractures. Your risk for osteoporosis can be identified using a bone density scan.  If you are 47 years of age or older, or if you are at risk for osteoporosis and fractures, ask your health care provider if you should be screened.  Ask your health care provider whether you should take a calcium or vitamin D supplement to lower your risk for osteoporosis.  Menopause may have certain physical symptoms and risks.  Hormone replacement therapy may reduce some of these symptoms and risks. Talk to your health care provider about whether hormone replacement therapy is right for you. Follow these instructions at home:  Schedule regular health, dental, and eye exams.  Stay current with your immunizations.  Do not use any tobacco products including cigarettes, chewing tobacco, or electronic cigarettes.  If you are pregnant, do not drink alcohol.  If you are breastfeeding, limit how much and how often you drink alcohol.  Limit alcohol intake to no more than 1 drink per day for nonpregnant women. One drink equals 12 ounces of beer, 5 ounces of wine, or 1 ounces of hard liquor.  Do not use street drugs.  Do not share needles.  Ask your health care provider for help if you need support or information about quitting drugs.  Tell your health care provider if you often feel depressed.  Tell your health care provider if you have ever been abused or do not feel safe at home. This information is not intended to replace advice given to you by your health care provider. Make sure you discuss any questions you have with your health care provider. Document Released: 08/22/2010 Document Revised: 07/15/2015 Document Reviewed:  11/10/2014 [employer]vier Interactive Patient Education  2018 [employer]       Agustina Caroli, MD [facility]

## 2017-03-07 LAB — COMPREHENSIVE METABOLIC PANEL
A/G RATIO: 1.5 (ref 1.2–2.2)
ALBUMIN: 4.3 g/dL (ref 3.5–4.8)
ALT: 21 IU/L (ref 0–32)
AST: 23 IU/L (ref 0–40)
Alkaline Phosphatase: 64 IU/L (ref 39–117)
BILIRUBIN TOTAL: 0.3 mg/dL (ref 0.0–1.2)
BUN / CREAT RATIO: 26 (ref 12–28)
BUN: 22 mg/dL (ref 8–27)
CHLORIDE: 103 mmol/L (ref 96–106)
CO2: 21 mmol/L (ref 20–29)
Calcium: 9.4 mg/dL (ref 8.7–10.3)
Creatinine, Ser: 0.85 mg/dL (ref 0.57–1.00)
GFR calc non Af Amer: 66 mL/min/{1.73_m2} (ref >59)
GFR, EST AFRICAN AMERICAN: 76 mL/min/{1.73_m2} (ref >59)
GLOBULIN, TOTAL: 2.8 g/dL (ref 1.5–4.5)
Glucose: 127 mg/dL — ABNORMAL HIGH (ref 65–99)
POTASSIUM: 4.5 mmol/L (ref 3.5–5.2)
SODIUM: 141 mmol/L (ref 134–144)
TOTAL PROTEIN: 7.1 g/dL (ref 6.0–8.5)

## 2017-03-07 LAB — CBC WITH DIFFERENTIAL/PLATELET
BASOS: 0 %
Basophils Absolute: 0 10*3/uL (ref 0.0–0.2)
EOS (ABSOLUTE): 0.1 10*3/uL (ref 0.0–0.4)
Eos: 1 %
Hematocrit: 36 % (ref 34.0–46.6)
Hemoglobin: 11.9 g/dL (ref 11.1–15.9)
IMMATURE GRANS (ABS): 0 10*3/uL (ref 0.0–0.1)
Immature Granulocytes: 0 %
Lymphocytes Absolute: 1.5 10*3/uL (ref 0.7–3.1)
Lymphs: 17 %
MCH: 31.3 pg (ref 26.6–33.0)
MCHC: 33.1 g/dL (ref 31.5–35.7)
MCV: 95 fL (ref 79–97)
Monocytes Absolute: 0.8 10*3/uL (ref 0.1–0.9)
Monocytes: 9 %
NEUTROS ABS: 6.1 10*3/uL (ref 1.4–7.0)
Neutrophils: 73 %
Platelets: 290 10*3/uL (ref 150–379)
RBC: 3.8 x10E6/uL (ref 3.77–5.28)
RDW: 14.3 % (ref 12.3–15.4)
WBC: 8.5 10*3/uL (ref 3.4–10.8)

## 2017-03-07 LAB — LIPID PANEL
Chol/HDL Ratio: 3.4 ratio (ref 0.0–4.4)
Cholesterol, Total: 136 mg/dL (ref 100–199)
HDL: 40 mg/dL (ref >39)
LDL Calculated: 57 mg/dL (ref 0–99)
Triglycerides: 193 mg/dL — ABNORMAL HIGH (ref 0–149)
VLDL Cholesterol Cal: 39 mg/dL (ref 5–40)

## 2017-03-07 LAB — TSH: TSH: 0.737 u[IU]/mL (ref 0.450–4.500)

## 2017-03-08 ENCOUNTER — Encounter: Payer: Self-pay | Admitting: *Deleted

## 2017-03-23 ENCOUNTER — Other Ambulatory Visit: Payer: Self-pay | Admitting: Emergency Medicine

## 2017-03-23 MED ORDER — METOPROLOL TARTRATE 25 MG PO TABS: 25.0000 mg | ORAL_TABLET | Freq: Two times a day (BID) | ORAL | 1 refills | Status: DC

## 2017-03-26 ENCOUNTER — Ambulatory Visit: Payer: 59 | Admitting: Emergency Medicine

## 2017-03-27 ENCOUNTER — Ambulatory Visit (INDEPENDENT_AMBULATORY_CARE_PROVIDER_SITE_OTHER): Payer: 59 | Admitting: Emergency Medicine

## 2017-03-27 ENCOUNTER — Other Ambulatory Visit: Payer: Self-pay

## 2017-03-27 ENCOUNTER — Encounter: Payer: Self-pay | Admitting: Emergency Medicine

## 2017-03-27 VITALS — BP 132/62 | HR 49 | Temp 97.7°F | Resp 12 | Ht 63.5 in | Wt 169.2 lb

## 2017-03-27 DIAGNOSIS — I10 Essential (primary) hypertension: Secondary | ICD-10-CM

## 2017-03-27 NOTE — Progress Notes (Signed)
Heidi Preston 77 y.o.   Chief Complaint  Patient presents with  . Hypertension    Going up and down, not stable. 150/70, 110/80. Sometimes will go up to 178/80. Then normal, then 160/85. No consistency.    HISTORY OF PRESENT ILLNESS: This is a 78 y.o. female here for follow-up visit of hypertension.  Feels well and has no complaints.  Compliant with medications.  HPI   Prior to Admission medications   Medication Sig Start Date End Date Taking? Authorizing Provider  atorvastatin (LIPITOR) 40 MG tablet Take 80 mg by mouth daily.    Yes [provider]  esomeprazole (NEXIUM) 40 MG capsule Take 40 mg by mouth daily before breakfast.   Yes [provider]  FLUoxetine (PROZAC) 20 MG capsule Take 20 mg by mouth daily.   Yes [provider]  gabapentin (NEURONTIN) 300 MG capsule Take 300 mg by mouth daily.   Yes [provider]  ibuprofen (ADVIL,MOTRIN) 800 MG tablet Take 1 tablet (800 mg total) by mouth every 8 (eight) hours as needed. 03/06/16  Yes Lawyer, Cristal Deer, PA-C  levothyroxine (SYNTHROID, LEVOTHROID) 50 MCG tablet  11/15/15  Yes [provider]  losartan-hydrochlorothiazide (HYZAAR) 100-12.5 MG tablet Take 1 tablet by mouth daily.   Yes [provider]  metoprolol tartrate (LOPRESSOR) 25 MG tablet Take 1 tablet (25 mg total) by mouth 2 (two) times daily. 03/23/17 06/21/17 Yes Ardis Fullwood, Eilleen Kempf, MD  mirtazapine (REMERON) 15 MG tablet Take 15 mg by mouth at bedtime.   Yes [provider]  atorvastatin (LIPITOR) 20 MG tablet Take 1 tablet (20 mg total) by mouth daily. Patient not taking: Reported on 03/06/2017 05/05/12   Dow Adolph, MD  metFORMIN (GLUCOPHAGE) 500 MG tablet Take 500 mg by mouth daily with breakfast.    [provider]    Allergies  Allergen Reactions  . Pravastatin Other (See Comments)    Headache    Patient Active Problem List   Diagnosis Date Noted  . History of depression  03/06/2017  . History of hypothyroidism 03/06/2017  . History of hyperglycemia 03/06/2017  . Chest pain 05/04/2012  . Urinary incontinence 05/04/2012  . Pulmonary nodule 05/25/2011  . Chronic cough 10/05/2009  . KNEE PAIN, LEFT 05/19/2009  . INSOMNIA 03/08/2009  . HYPERTENSION, BENIGN ESSENTIAL 02/17/2009  . PAIN IN JOINT, MULTIPLE SITES 02/17/2009  . OSTEOARTHRITIS 11/23/2008  . POSTHERPETIC NEURALGIA 09/04/2008  . 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 UTERINE PROLAPSE 01/01/2008  . Hypothyroidism 09/01/2006  . 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 OBESITY NOS 09/01/2006  . DEPRESSION 09/01/2006  . GASTROESOPHAGEAL REFLUX DISEASE 09/01/2006  . VAGINITIS, ATROPHIC 09/01/2006  . 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 Headache(784.0) 09/01/2006  . HYPERGLYCEMIA 09/01/2006  . ABNORMAL RESULT, FUNCTION STUDY, LIVER 09/01/2006    Past Medical History:  Diagnosis Date  . Anxiety   . Arthritis   . Blepharitis, both eyes   . Esophagitis, reflux   . Fatigue   . Hypercholesteremia   . Hypertension   . Hypothyroid   . Neuromuscular disorder [facility])     Past Surgical History:  Procedure Laterality Date  . CHOLECYSTECTOMY     45 years ago  . SHOULDER SURGERY    . TOTAL ABDOMINAL HYSTERECTOMY  2012    Social History   Socioeconomic History  . Marital status: Married    Spouse name: Not on file  . Number of children: 4  . Years of education: Not on file  . Highest education level: Not  on file  Social Needs  . Financial resource strain: Not on file  . Food insecurity - worry: Not on file  . Food insecurity - inability: Not on file  . Transportation needs - medical: Not on file  . Transportation needs - non-medical: Not on file  Occupational History  . Occupation: housewife  Tobacco Use  . Smoking status: Never Smoker  . Smokeless tobacco: Never Used  Substance and Sexual Activity  . Alcohol use: No  . Drug use: No  . Sexual activity: No  Other Topics Concern  . Not on file    Social History Narrative  . Not on file    Family History  Problem Relation Age of Onset  . Colon cancer Neg Hx   . Stomach cancer Neg Hx   . Esophageal cancer Neg Hx   . Rectal cancer Neg Hx   . Liver cancer Neg Hx      Review of Systems  Constitutional: Negative.  Negative for chills, fever and weight loss.  Eyes: Negative.   Respiratory: Negative.  Negative for cough and shortness of breath.   Cardiovascular: Negative.  Negative for chest pain and palpitations.  Gastrointestinal: Negative.  Negative for abdominal pain, diarrhea, nausea and vomiting.       Frequent flatulence, bloating, but no pain.  Genitourinary: Negative.  Negative for dysuria and hematuria.  Musculoskeletal: Negative.  Negative for myalgias and neck pain.  Skin: Negative.   Neurological: Negative.   Endo/Heme/Allergies: Negative.   All other systems reviewed and are negative.   Vitals:   03/27/17 1049  BP: 132/62  Pulse: (!) 49  Resp: 12  Temp: 97.7 F (36.5 C)  SpO2: 98%    Physical Exam  Constitutional: She is oriented to person, place, and time. She appears well-developed and well-nourished.  HENT:  Head: Normocephalic.  Mouth/Throat: Oropharynx is clear and moist.  Eyes: Conjunctivae and EOM are normal. Pupils are equal, round, and reactive to light.  Neck: Normal range of motion. Neck supple. No thyromegaly present.  Cardiovascular: Normal rate, regular rhythm and normal heart sounds.  Pulmonary/Chest: Effort normal and breath sounds normal.  Abdominal: Soft. Bowel sounds are normal. She exhibits no distension. There is no tenderness.  Musculoskeletal: Normal range of motion.  Lymphadenopathy:    She has no cervical adenopathy.  Neurological: She is alert and oriented to person, place, and time. No sensory deficit. She exhibits normal muscle tone.  Skin: Skin is warm and dry. Capillary refill takes less than 2 seconds. No rash noted.  Psychiatric: She has a normal mood and affect.  Her behavior is normal.  Vitals reviewed.   A total of 25 minutes was spent in the room with the patient, greater than 50% of which was in counseling/coordination of care.  ASSESSMENT & PLAN: [name] was seen today for hypertension.  Diagnoses and all orders for this visit:  HYPERTENSION, BENIGN ESSENTIAL    Patient Instructions    Tome el Metropolol una vez al dia solamente.   IF you received an x-ray today, you will receive an invoice from [facility]. Please contact [facility] at [phone] with questions or concerns regarding your invoice.   IF you received labwork today, you will receive an invoice from [place]. Please contact [facility] at [phone] with questions or concerns regarding your invoice.   Our billing staff will not be able to assist you with questions regarding bills from these companies.  You will be contacted with the lab results as soon as  they are available. The fastest way to get your results is to activate your My Chart account. Instructions are located on the last page of this paperwork. If you have not heard from [place] regarding the results in 2 weeks, please contact this office.     Hipertensin Hypertension El trmino hipertensin es otra forma de denominar a la presin arterial elevada. La presin arterial elevada fuerza al corazn a trabajar ms para bombear la sangre. Esto puede causar problemas con el paso del [place]. Una lectura de presin arterial est compuesta por 2 nmeros. Hay un nmero superior (sistlico) sobre un nmero inferior (diastlico). Lo ideal es tener la presin arterial por debajo de 120/80. Las decisiones saludables pueden ayudarle a disminuir su presin arterial. Es posible que necesite medicamentos que le ayuden a disminuir su presin arterial si:  Su presin arterial no disminuye mediante decisiones saludables.  Su presin arterial est por encima de 130/80.  Siga estas instrucciones en su casa: Comida  y bebida  Si se lo indican, siga el plan de alimentacin de DASH (Dietary Approaches to Stop Hypertension, Maneras de alimentarse para detener la hipertensin). Esta dieta incluye: ? Que la mitad del plato de cada comida sea de frutas y verduras. ? Que un cuarto del plato de cada comida sea de cereales integrales. Los cereales integrales incluyen pasta integral, arroz integral y pan integral. ? Comer y beber productos lcteos con bajo contenido de [place], como leche descremada o yogur bajo en grasas. ? Que un cuarto del plato de cada comida sea de protenas bajas en grasa (magras). Las protenas bajas en grasa incluyen pescado, pollo sin piel, huevos, frijoles y tofu. ? Evitar consumir carne grasa, carne curada y procesada, o pollo con piel. ? Evitar consumir alimentos prehechos o procesados.  Consuma menos de 1500 mg de sal (sodio) por da.  Limite el consumo de alcohol a no ms de 1 medida por da si es mujer y no est Orthoptist y a 2 medidas por da si es hombre. Una medida equivale a 12onzas de cerveza, 5onzas de vino o 1onzas de bebidas alcohlicas de alta graduacin. Estilo de vida  Trabaje con su mdico para mantenerse en un peso saludable o para perder peso. Pregntele a su mdico cul es el peso recomendable para usted.  Realice al menos 30 minutos de ejercicio que haga que se acelere su corazn (ejercicio Magazine features editor) la [employer] de la [place]. Estos pueden incluir caminar, nadar o andar en bicicleta.  Realice al menos 30 minutos de ejercicio que fortalezca sus msculos (ejercicios de resistencia) al menos 3 das a la [place]. Estos pueden incluir levantar pesas o hacer pilates.  No consuma ningn producto que contenga nicotina o tabaco. Esto incluye cigarrillos y cigarrillos electrnicos. Si necesita ayuda para dejar de fumar, consulte al [employer].  Controle su presin arterial en su casa tal como le indic el mdico.  Concurra a todas las visitas de control como se lo  haya indicado el mdico. Esto es importante. Medicamentos  [employer] de venta libre y los recetados solamente como se lo haya indicado el mdico. Siga cuidadosamente las indicaciones.  No omita las dosis de medicamentos para la presin arterial. Los medicamentos pierden eficacia si omite dosis. El hecho de omitir las dosis tambin [place] el riesgo de otros problemas.  Pregntele a su mdico a qu efectos secundarios o reacciones a los Museum/gallery curator. Comunquese con un mdico si:  Piensa que tiene [place] reaccin a los [employer]  que est tomando.  Tiene dolores de cabeza frecuentes (recurrentes).  Siente mareos.  Tiene hinchazn en los tobillos.  Tiene problemas de visin. Solicite ayuda de inmediato si:  Siente un dolor de cabeza muy intenso.  Comienza a sentirse confundido.  Se siente dbil o adormecido.  Siente que va a desmayarse.  Siente un dolor muy intenso en: ? El pecho. ? El vientre (abdomen).  Devuelve (vomita) ms de una vez.  Tiene dificultad para respirar. Resumen  El trmino hipertensin es otra forma de denominar a la presin arterial elevada.  Las decisiones saludables pueden ayudarle a disminuir su presin arterial. Si no puede controlar su presin arterial mediante decisiones saludables, es posible que deba tomar medicamentos. Esta informacin no tiene Theme park managercomo fin reemplazar el consejo del mdico. Asegrese de hacerle al mdico cualquier pregunta que tenga. Document Released: 07/27/2009 Document Revised: 01/19/2016 Document Reviewed: 01/19/2016 Elsevier Interactive Patient Education  2018 Elsevier Inc.      Edwina BarthMiguel Sunjai Levandoski, MD [facility]

## 2017-03-27 NOTE — Patient Instructions (Addendum)
Tome el Metropolol una vez al dia solamente.   IF you received an x-ray today, you will receive an invoice from [facility]. Please contact [facility] at [phone] with questions or concerns regarding your invoice.   IF you received labwork today, you will receive an invoice from [place]. Please contact [facility] at [phone] with questions or concerns regarding your invoice.   Our billing staff will not be able to assist you with questions regarding bills from these companies.  You will be contacted with the lab results as soon as they are available. The fastest way to get your results is to activate your My Chart account. Instructions are located on the last page of this paperwork. If you have not heard from [place] regarding the results in 2 weeks, please contact this office.     Hipertensin Hypertension El trmino hipertensin es otra forma de denominar a la presin arterial elevada. La presin arterial elevada fuerza al corazn a trabajar ms para bombear la sangre. Esto puede causar problemas con el paso del [place]. Una lectura de presin arterial est compuesta por 2 nmeros. Hay un nmero superior (sistlico) sobre un nmero inferior (diastlico). Lo ideal es tener la presin arterial por debajo de 120/80. Las decisiones saludables pueden ayudarle a disminuir su presin arterial. Es posible que necesite medicamentos que le ayuden a disminuir su presin arterial si:  Su presin arterial no disminuye mediante decisiones saludables.  Su presin arterial est por encima de 130/80.  Siga estas instrucciones en su casa: Comida y bebida  Si se lo indican, siga el plan de alimentacin de DASH (Dietary Approaches to Stop Hypertension, Maneras de alimentarse para detener la hipertensin). Esta dieta incluye: ? Que la mitad del plato de cada comida sea de frutas y verduras. ? Que un cuarto del plato de cada comida sea de cereales integrales. Los cereales integrales  incluyen pasta integral, arroz integral y pan integral. ? Comer y beber productos lcteos con bajo contenido de [place], como leche descremada o yogur bajo en grasas. ? Que un cuarto del plato de cada comida sea de protenas bajas en grasa (magras). Las protenas bajas en grasa incluyen pescado, pollo sin piel, huevos, frijoles y tofu. ? Evitar consumir carne grasa, carne curada y procesada, o pollo con piel. ? Evitar consumir alimentos prehechos o procesados.  Consuma menos de 1500 mg de sal (sodio) por da.  Limite el consumo de alcohol a no ms de 1 medida por da si es mujer y no est Orthoptist y a 2 medidas por da si es hombre. Una medida equivale a 12onzas de cerveza, 5onzas de vino o 1onzas de bebidas alcohlicas de alta graduacin. Estilo de vida  Trabaje con su mdico para mantenerse en un peso saludable o para perder peso. Pregntele a su mdico cul es el peso recomendable para usted.  Realice al menos 30 minutos de ejercicio que haga que se acelere su corazn (ejercicio Magazine features editor) la [employer] de la [place]. Estos pueden incluir caminar, nadar o andar en bicicleta.  Realice al menos 30 minutos de ejercicio que fortalezca sus msculos (ejercicios de resistencia) al menos 3 das a la [place]. Estos pueden incluir levantar pesas o hacer pilates.  No consuma ningn producto que contenga nicotina o tabaco. Esto incluye cigarrillos y cigarrillos electrnicos. Si necesita ayuda para dejar de fumar, consulte al [employer].  Controle su presin arterial en su casa tal como le indic el mdico.  Concurra a todas las visitas de control como se  lo haya indicado el mdico. Esto es importante. Medicamentos  Baxter Internationalome los medicamentos de venta libre y los recetados solamente como se lo haya indicado el mdico. Siga cuidadosamente las indicaciones.  No omita las dosis de medicamentos para la presin arterial. Los medicamentos pierden eficacia si omite dosis. El hecho de omitir las dosis  tambin [place] el riesgo de otros problemas.  Pregntele a su mdico a qu efectos secundarios o reacciones a los Museum/gallery curatormedicamentos debe prestar atencin. Comunquese con un mdico si:  Piensa que tiene [place] reaccin a los medicamentos que est tomando.  Tiene dolores de cabeza frecuentes (recurrentes).  Siente mareos.  Tiene hinchazn en los tobillos.  Tiene problemas de visin. Solicite ayuda de inmediato si:  Siente un dolor de cabeza muy intenso.  Comienza a sentirse confundido.  Se siente dbil o adormecido.  Siente que va a desmayarse.  Siente un dolor muy intenso en: ? El pecho. ? El vientre (abdomen).  Devuelve (vomita) ms de una vez.  Tiene dificultad para respirar. Resumen  El trmino hipertensin es otra forma de denominar a la presin arterial elevada.  Las decisiones saludables pueden ayudarle a disminuir su presin arterial. Si no puede controlar su presin arterial mediante decisiones saludables, es posible que deba tomar medicamentos. Esta informacin no tiene Theme park managercomo fin reemplazar el consejo del mdico. Asegrese de hacerle al mdico cualquier pregunta que tenga. Document Released: 07/27/2009 Document Revised: 01/19/2016 Document Reviewed: 01/19/2016 Elsevier Interactive Patient Education  [employer]2018 Elsevier Inc.

## 2017-04-10 ENCOUNTER — Ambulatory Visit (HOSPITAL_COMMUNITY)
Admission: EM | Admit: 2017-04-10 | Discharge: 2017-04-10 | Disposition: A | Discharge status: Home / Self Care | Payer: 59 | Attending: Internal Medicine | Admitting: Internal Medicine

## 2017-04-10 ENCOUNTER — Other Ambulatory Visit: Payer: Self-pay

## 2017-04-10 ENCOUNTER — Ambulatory Visit (INDEPENDENT_AMBULATORY_CARE_PROVIDER_SITE_OTHER): Payer: 59 | Admitting: Emergency Medicine

## 2017-04-10 ENCOUNTER — Emergency Department (HOSPITAL_COMMUNITY): Admission: EM | Admit: 2017-04-10 | Discharge: 2017-04-10 | Discharge status: Absent without leave | Payer: 59

## 2017-04-10 ENCOUNTER — Encounter: Payer: Self-pay | Admitting: Emergency Medicine

## 2017-04-10 ENCOUNTER — Encounter (HOSPITAL_COMMUNITY): Payer: Self-pay | Admitting: Emergency Medicine

## 2017-04-10 VITALS — BP 122/72 | HR 64 | Temp 98.3°F | Resp 16 | Wt 167.2 lb

## 2017-04-10 DIAGNOSIS — R197 Diarrhea, unspecified: Secondary | ICD-10-CM

## 2017-04-10 DIAGNOSIS — R531 Weakness: Secondary | ICD-10-CM

## 2017-04-10 DIAGNOSIS — R32 Unspecified urinary incontinence: Secondary | ICD-10-CM | POA: Insufficient documentation

## 2017-04-10 DIAGNOSIS — E782 Mixed hyperlipidemia: Secondary | ICD-10-CM | POA: Diagnosis not present

## 2017-04-10 DIAGNOSIS — K219 Gastro-esophageal reflux disease without esophagitis: Secondary | ICD-10-CM | POA: Insufficient documentation

## 2017-04-10 DIAGNOSIS — R194 Change in bowel habit: Secondary | ICD-10-CM | POA: Insufficient documentation

## 2017-04-10 DIAGNOSIS — K580 Irritable bowel syndrome with diarrhea: Secondary | ICD-10-CM | POA: Insufficient documentation

## 2017-04-10 DIAGNOSIS — I10 Essential (primary) hypertension: Secondary | ICD-10-CM | POA: Diagnosis not present

## 2017-04-10 DIAGNOSIS — K6389 Other specified diseases of intestine: Secondary | ICD-10-CM | POA: Insufficient documentation

## 2017-04-10 DIAGNOSIS — E669 Obesity, unspecified: Secondary | ICD-10-CM | POA: Insufficient documentation

## 2017-04-10 DIAGNOSIS — M1990 Unspecified osteoarthritis, unspecified site: Secondary | ICD-10-CM | POA: Insufficient documentation

## 2017-04-10 DIAGNOSIS — F419 Anxiety disorder, unspecified: Secondary | ICD-10-CM | POA: Insufficient documentation

## 2017-04-10 DIAGNOSIS — Z9049 Acquired absence of other specified parts of digestive tract: Secondary | ICD-10-CM | POA: Insufficient documentation

## 2017-04-10 DIAGNOSIS — F329 Major depressive disorder, single episode, unspecified: Secondary | ICD-10-CM | POA: Diagnosis not present

## 2017-04-10 DIAGNOSIS — E039 Hypothyroidism, unspecified: Secondary | ICD-10-CM | POA: Diagnosis not present

## 2017-04-10 DIAGNOSIS — Z79890 Hormone replacement therapy (postmenopausal): Secondary | ICD-10-CM | POA: Insufficient documentation

## 2017-04-10 DIAGNOSIS — Z79899 Other long term (current) drug therapy: Secondary | ICD-10-CM | POA: Diagnosis not present

## 2017-04-10 DIAGNOSIS — R1084 Generalized abdominal pain: Secondary | ICD-10-CM

## 2017-04-10 DIAGNOSIS — E860 Dehydration: Secondary | ICD-10-CM | POA: Diagnosis not present

## 2017-04-10 DIAGNOSIS — Z7984 Long term (current) use of oral hypoglycemic drugs: Secondary | ICD-10-CM | POA: Diagnosis not present

## 2017-04-10 LAB — POCT I-STAT, CHEM 8
BUN: 18 mg/dL (ref 6–20)
CREATININE: 0.7 mg/dL (ref 0.44–1.00)
Calcium, Ion: 1.21 mmol/L (ref 1.15–1.40)
Chloride: 101 mmol/L (ref 101–111)
Glucose, Bld: 126 mg/dL — ABNORMAL HIGH (ref 65–99)
HEMATOCRIT: 39 % (ref 36.0–46.0)
Hemoglobin: 13.3 g/dL (ref 12.0–15.0)
POTASSIUM: 4.1 mmol/L (ref 3.5–5.1)
SODIUM: 138 mmol/L (ref 135–145)
TCO2: 26 mmol/L (ref 22–32)

## 2017-04-10 LAB — POCT URINALYSIS DIP (DEVICE)
Bilirubin Urine: NEGATIVE
GLUCOSE, UA: NEGATIVE mg/dL
KETONES UR: NEGATIVE mg/dL
Leukocytes, UA: NEGATIVE
NITRITE: NEGATIVE
PH: 6 (ref 5.0–8.0)
PROTEIN: NEGATIVE mg/dL
Specific Gravity, Urine: 1.025 (ref 1.005–1.030)
Urobilinogen, UA: 0.2 mg/dL (ref 0.0–1.0)

## 2017-04-10 MED ORDER — LOPERAMIDE HCL 2 MG PO CAPS: 2.0000 mg | ORAL_CAPSULE | Freq: Four times a day (QID) | ORAL | 0 refills | Status: DC | PRN

## 2017-04-10 NOTE — Discharge Instructions (Signed)
Please increase the amount of fluids you are drinking, water, gatorade, pedialyte to ensure adequate hydration.  Immodium up to 4 times a day for three days total.  Please continue to follow with your primary care provider and GI as already scheduled for further evaluation. If worsening pain, blood in diarrhea, dehydration, or development of vomiting please go to ER for further evaluation.

## 2017-04-10 NOTE — Patient Instructions (Addendum)
  Go to the ER now.   IF you received an x-ray today, you will [facility] an [facility]. Please contact [facility] at [phone] with questions or concerns regarding your invoice.   IF you received labwork today, you will receive an invoice from [facility]. Please contact [facility] at [phone] with questions or concerns regarding your invoice.   Our billing staff will not be able to assist you with questions regarding bills from these companies.  You will be contacted with the lab results as soon as they are available. The fastest way to get your results is to activate your My Chart account. Instructions are located on the last page of this paperwork. If you have not heard from us regarding the results in 2 weeks, please contact this office.

## 2017-04-10 NOTE — Progress Notes (Signed)
Heidi Preston 77 y.o.   Chief Complaint  Patient presents with  . Diarrhea    x 3-4 days    HISTORY OF PRESENT ILLNESS: This is a 77 y.o. female complaining of daily watery diarrhea for the past 7-8 days.  Going from a 7-10 times a day.  Took Pepto-Bismol and the stools turned dark for couple days.  Normal color watery stools for the past few days.  Has intermittent abdominal cramping/pain.  Denies nausea vomiting.  Positive chills but no fever.  Feels very weak and dehydrated.  No appetite.  Drinking Gatorade.  HPI   Prior to Admission medications   Medication Sig Start Date End Date Taking? Authorizing Provider  FLUoxetine (PROZAC) 20 MG capsule Take 20 mg by mouth daily.   Yes [provider]  gabapentin (NEURONTIN) 300 MG capsule Take 300 mg by mouth daily.   Yes [provider]  levothyroxine (SYNTHROID, LEVOTHROID) 50 MCG tablet  11/15/15  Yes [provider]  metFORMIN (GLUCOPHAGE) 500 MG tablet Take 500 mg by mouth daily with breakfast.   Yes [provider]  metoprolol tartrate (LOPRESSOR) 25 MG tablet Take 1 tablet (25 mg total) by mouth 2 (two) times daily. 03/23/17 06/21/17 Yes Syvanna Ciolino, Eilleen Kempf, MD  mirtazapine (REMERON) 15 MG tablet Take 15 mg by mouth at bedtime.   Yes [provider]  atorvastatin (LIPITOR) 20 MG tablet Take 1 tablet (20 mg total) by mouth daily. Patient not taking: Reported on 03/06/2017 05/05/12   Dow Adolph, MD  atorvastatin (LIPITOR) 40 MG tablet Take 80 mg by mouth daily.     [provider]  esomeprazole (NEXIUM) 40 MG capsule Take 40 mg by mouth daily before breakfast.    [provider]  ibuprofen (ADVIL,MOTRIN) 800 MG tablet Take 1 tablet (800 mg total) by mouth every 8 (eight) hours as needed. Patient not taking: Reported on 04/10/2017 03/06/16   Charlestine Night, PA-C  losartan-hydrochlorothiazide (HYZAAR) 100-12.5 MG tablet Take 1 tablet by mouth daily.    [provider]    Allergies  Allergen Reactions  . Pravastatin Other (See Comments)    Headache    Patient Active Problem List   Diagnosis Date Noted  . History of depression 03/06/2017  . History of hypothyroidism 03/06/2017  . History of hyperglycemia 03/06/2017  . Chest pain 05/04/2012  . Urinary incontinence 05/04/2012  . Pulmonary nodule 05/25/2011  . Chronic cough 10/05/2009  . KNEE PAIN, LEFT 05/19/2009  . INSOMNIA 03/08/2009  . HYPERTENSION, BENIGN ESSENTIAL 02/17/2009  . PAIN IN JOINT, MULTIPLE SITES 02/17/2009  . OSTEOARTHRITIS 11/23/2008  . POSTHERPETIC NEURALGIA 09/04/2008  . 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 UTERINE PROLAPSE 01/01/2008  . Hypothyroidism 09/01/2006  . 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 OBESITY NOS 09/01/2006  . DEPRESSION 09/01/2006  . GASTROESOPHAGEAL REFLUX DISEASE 09/01/2006  . VAGINITIS, ATROPHIC 09/01/2006  . 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 Headache(784.0) 09/01/2006  . HYPERGLYCEMIA 09/01/2006  . ABNORMAL RESULT, FUNCTION STUDY, LIVER 09/01/2006    Past Medical History:  Diagnosis Date  . Anxiety   . Arthritis   . Blepharitis, both eyes   . Esophagitis, reflux   . Fatigue   . Hypercholesteremia   . Hypertension   . Hypothyroid   . Neuromuscular disorder [facility])     Past Surgical History:  Procedure Laterality Date  . CHOLECYSTECTOMY     45 years ago  . SHOULDER SURGERY    . TOTAL ABDOMINAL HYSTERECTOMY  2012    Social  History   Socioeconomic History  . Marital status: Married    Spouse name: Not on file  . Number of children: 4  . Years of education: Not on file  . Highest education level: Not on file  Social Needs  . Financial resource strain: Not on file  . Food insecurity - worry: Not on file  . Food insecurity - inability: Not on file  . Transportation needs - medical: Not on file  . Transportation needs - non-medical: Not on file  Occupational History  . Occupation: housewife  Tobacco Use  . Smoking  status: Never Smoker  . Smokeless tobacco: Never Used  Substance and Sexual Activity  . Alcohol use: No  . Drug use: No  . Sexual activity: No  Other Topics Concern  . Not on file  Social History Narrative  . Not on file    Family History  Problem Relation Age of Onset  . Colon cancer Neg Hx   . Stomach cancer Neg Hx   . Esophageal cancer Neg Hx   . Rectal cancer Neg Hx   . Liver cancer Neg Hx      Review of Systems  Constitutional: Positive for malaise/fatigue. Negative for chills and fever.  HENT: Negative.   Eyes: Negative.   Respiratory: Negative.  Negative for cough and shortness of breath.   Cardiovascular: Negative.  Negative for chest pain and palpitations.  Gastrointestinal: Positive for abdominal pain, diarrhea and melena. Negative for heartburn, nausea and vomiting.  Genitourinary: Negative.  Negative for dysuria and hematuria.  Musculoskeletal: Negative.   Skin: Negative for rash.  Neurological: Positive for weakness. Negative for dizziness and headaches.  Endo/Heme/Allergies: Negative.   All other systems reviewed and are negative.   Vitals:   04/10/17 0933  BP: 122/72  Pulse: 64  Resp: 16  Temp: 98.3 F (36.8 C)  SpO2: 96%    Physical Exam  Constitutional: She is oriented to person, place, and time. She appears well-developed. She appears ill.  HENT:  Head: Normocephalic and atraumatic.  Eyes: EOM are normal. Pupils are equal, round, and reactive to light.  Neck: Normal range of motion. Neck supple.  Cardiovascular: Normal rate, regular rhythm and normal heart sounds.  Pulmonary/Chest: Effort normal and breath sounds normal.  Abdominal: Soft. Bowel sounds are normal. She exhibits no distension and no mass. There is no tenderness. There is no rebound and no guarding.  Musculoskeletal: Normal range of motion.  Neurological: She is alert and oriented to person, place, and time. No sensory deficit. She exhibits normal muscle tone.  Skin: Skin is  warm and dry. Capillary refill takes less than 2 seconds. No rash noted.  Psychiatric: She has a normal mood and affect. Her behavior is normal.   A total of 25 minutes was spent in the room with the patient, greater than 50% of which was in counseling/coordination of care.   ASSESSMENT & PLAN: Advised to go to the ER now.  Needs IV hydration and stat blood work. Garland was seen today for diarrhea.  Diagnoses and all orders for this visit:  Dehydration  Diarrhea of presumed infectious origin  Generalized abdominal pain  General weakness   Patient Instructions    Go to the ER now.   IF you received an x-ray today, you will receive an invoice from [facility]. Please contact [facility] at [phone] with questions or concerns regarding your invoice.   IF you received labwork today, you will receive an invoice from [facility]. Please  contact [facility] at [phone] with questions or concerns regarding your invoice.   Our billing staff will not be able to assist you with questions regarding bills from these companies.  You will be contacted with the lab results as soon as they are available. The fastest way to get your results is to activate your My Chart account. Instructions are located on the last page of this paperwork. If you have not heard from us regarding the results in 2 weeks, please contact this office.           Edwina BarthMiguel Kaeson Kleinert, MD [facility]

## 2017-04-10 NOTE — ED Notes (Signed)
Pt given 'hat' and instructions for stool sample, Pt does not feel like she can go right now, waiting on PT to produce stool sample.

## 2017-04-10 NOTE — ED Triage Notes (Addendum)
Diarrhea for 2 weeks.  Patient has not seen a provider for symptoms.  Patient noticed dark color to stool today  History of the same.    Reports 6 episodes of diarrhea today.   Patient complains of weakness, skin warm and dry, pale.    [place] told patient to go to ed, family says too long of a wait and came to [facility]

## 2017-04-10 NOTE — ED Notes (Signed)
Notified natalie, np of patient

## 2017-04-10 NOTE — ED Provider Notes (Signed)
[facility]    CSN: [id number] Arrival date & time: 04/10/17  1147     History   Chief Complaint Chief Complaint  Patient presents with  . Diarrhea    HPI Heidi Preston is a 77 y.o. female.   Heidi HesselbachMaria presents with her family with complaints of diarrhea for the past 4 days which has been intermittent for the past 3 weeks. She has had this intermittently for long time, per chart review has been seen twice in just the past 6 months for altered bowel habits and IBS. She states that as soon as she eats anything it results in stool. She has to wear a brief due to frequency of loose stool. No blood in stool. She states she has a period of 3-4 days in between episodes with normal stools. Without nausea or vomiting. Has had increased stress per son, in that she has been "excitable and irritable" lately, but without specific stressor. without fevers. Denies urinary symptoms. Has taken pepto bismol which minimally helped, states caused her stool to appear more dark. Denies recent travel, recent hospitalization or antibiotic use. Colonoscopy 6 years ago. Has appointment in March with GI.     ROS per HPI.       Past Medical History:  Diagnosis Date  . Anxiety   . Arthritis   . Blepharitis, both eyes   . Esophagitis, reflux   . Fatigue   . Hypercholesteremia   . Hypertension   . Hypothyroid   . Neuromuscular disorder [facility](HCC)     Patient Active Problem List   Diagnosis Date Noted  . History of depression 03/06/2017  . History of hypothyroidism 03/06/2017  . History of hyperglycemia 03/06/2017  . Chest pain 05/04/2012  . Urinary incontinence 05/04/2012  . Pulmonary nodule 05/25/2011  . Chronic cough 10/05/2009  . KNEE PAIN, LEFT 05/19/2009  . INSOMNIA 03/08/2009  . HYPERTENSION, BENIGN ESSENTIAL 02/17/2009  . PAIN IN JOINT, MULTIPLE SITES 02/17/2009  . OSTEOARTHRITIS 11/23/2008  . POSTHERPETIC NEURALGIA 09/04/2008  . 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 UTERINE PROLAPSE  01/01/2008  . Hypothyroidism 09/01/2006  . 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 OBESITY NOS 09/01/2006  . DEPRESSION 09/01/2006  . GASTROESOPHAGEAL REFLUX DISEASE 09/01/2006  . VAGINITIS, ATROPHIC 09/01/2006  . 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 Headache(784.0) 09/01/2006  . HYPERGLYCEMIA 09/01/2006  . ABNORMAL RESULT, FUNCTION STUDY, LIVER 09/01/2006    Past Surgical History:  Procedure Laterality Date  . CHOLECYSTECTOMY     45 years ago  . SHOULDER SURGERY    . TOTAL ABDOMINAL HYSTERECTOMY  2012    OB History    No data available       Home Medications    Prior to Admission medications   Medication Sig Start Date End Date Taking? Authorizing Provider  atorvastatin (LIPITOR) 20 MG tablet Take 1 tablet (20 mg total) by mouth daily. Patient not taking: Reported on 03/06/2017 05/05/12   Dow AdolphKazibwe, Richard, MD  atorvastatin (LIPITOR) 40 MG tablet Take 80 mg by mouth daily.     [provider]  esomeprazole (NEXIUM) 40 MG capsule Take 40 mg by mouth daily before breakfast.    [provider]  FLUoxetine (PROZAC) 20 MG capsule Take 20 mg by mouth daily.    [provider]  gabapentin (NEURONTIN) 300 MG capsule Take 300 mg by mouth daily.    [provider]  ibuprofen (ADVIL,MOTRIN) 800 MG tablet Take 1 tablet (800 mg total) by mouth every 8 (eight) hours as needed. Patient  not taking: Reported on 04/10/2017 03/06/16   Charlestine Night, PA-C  levothyroxine (SYNTHROID, LEVOTHROID) 50 MCG tablet  11/15/15   [provider]  loperamide (IMODIUM) 2 MG capsule Take 1 capsule (2 mg total) by mouth 4 (four) times daily as needed for diarrhea or loose stools. 04/10/17   Georgetta Haber, NP  losartan-hydrochlorothiazide (HYZAAR) 100-12.5 MG tablet Take 1 tablet by mouth daily.    [provider]  metFORMIN (GLUCOPHAGE) 500 MG tablet Take 500 mg by mouth daily with breakfast.    [provider]  metoprolol tartrate (LOPRESSOR) 25  MG tablet Take 1 tablet (25 mg total) by mouth 2 (two) times daily. 03/23/17 06/21/17  Georgina Quint, MD  mirtazapine (REMERON) 15 MG tablet Take 15 mg by mouth at bedtime.    [provider]    Family History Family History  Problem Relation Age of Onset  . Colon cancer Neg Hx   . Stomach cancer Neg Hx   . Esophageal cancer Neg Hx   . Rectal cancer Neg Hx   . Liver cancer Neg Hx     Social History Social History   Tobacco Use  . Smoking status: Never Smoker  . Smokeless tobacco: Never Used  Substance Use Topics  . Alcohol use: No  . Drug use: No     Allergies   Pravastatin   Review of Systems Review of Systems   Physical Exam Triage Vital Signs ED Triage Vitals [04/10/17 1253]  Enc Vitals Group     BP 136/63     Pulse Rate 60     Resp (!) 24     Temp 98.4 F (36.9 C)     Temp Source Oral     SpO2 100 %     Weight      Height      Head Circumference      Peak Flow      Pain Score      Pain Loc      Pain Edu?      Excl. in GC?    No data found.  Updated Vital Signs BP 136/63 (BP Location: Left Arm)   Pulse 60   Temp 98.4 F (36.9 C) (Oral)   Resp (!) 24   SpO2 100%   Visual Acuity Right Eye Distance:   Left Eye Distance:   Bilateral Distance:    Right Eye Near:   Left Eye Near:    Bilateral Near:     Physical Exam  Constitutional: She is oriented to person, place, and time. She appears well-developed and well-nourished. No distress.  Cardiovascular: Normal rate, regular rhythm and normal heart sounds.  Pulmonary/Chest: Effort normal and breath sounds normal.  Abdominal: Soft. Bowel sounds are normal. She exhibits distension. There is tenderness in the right lower quadrant, suprapubic area and left lower quadrant.  Mild low abdominal tenderness on deep palpation, mild distension noted.   Neurological: She is alert and oriented to person, place, and time.  Skin: Skin is warm and dry.     UC Treatments / Results  Labs (all  labs ordered are listed, but only abnormal results are displayed) Labs Reviewed  POCT URINALYSIS DIP (DEVICE) - Abnormal; Notable for the following components:      Result Value   Hgb urine dipstick TRACE (*)    All other components within normal limits  POCT I-STAT, CHEM 8 - Abnormal; Notable for the following components:   Glucose, Bld 126 (*)  All other components within normal limits  GASTROINTESTINAL PANEL BY PCR, STOOL (REPLACES STOOL CULTURE)    EKG  EKG Interpretation None       Radiology No results found.  Procedures Procedures (including critical care time)  Medications Ordered in UC Medications - No data to display   Initial Impression / Assessment and Plan / UC Course  I have reviewed the triage vital signs and the nursing notes.  Pertinent labs & imaging results that were available during my care of the patient were reviewed by me and considered in my medical decision making (see chart for details).     Chem 8 and urine dip without acute findings of anemia or signs of dehydration. Taking fluids PO without vomiting. Alert, active, non toxic in appearance. Appears to be chronic in nature. Stool sample pending. Will try imodium for three days to see if symptoms improve. Encouraged close follow up with PCP and GI for continued monitoring and management of symptoms. Patient and family verbalized understanding and agreeable to plan.    Final Clinical Impressions(s) / UC Diagnoses   Final diagnoses:  Diarrhea, unspecified type    ED Discharge Orders        Ordered    loperamide (IMODIUM) 2 MG capsule  4 times daily PRN     04/10/17 1439       Controlled Substance Prescriptions [place] Controlled Substance Registry consulted? Not Applicable   Georgetta Haber, NP 04/10/17 1448

## 2017-04-11 LAB — GASTROINTESTINAL PANEL BY PCR, STOOL (REPLACES STOOL CULTURE)

## 2017-04-19 NOTE — Progress Notes (Signed)
[facility] Progress Note  Chief Complaint: Abdominal pain and diarrhea  Subjective  History:  This is a 77 year old woman known to me from 2 prior office visits in 2017 in 2018. She had previously seen Dr. Saralyn Pilar on going back to at least 2013. His evaluation for chronic cough revealed no significant reflux, colon biopsies negative for microscopic colitis. See my prior notes for details. I also gave empiric treatment for SIBO with Keflex and flagyl without improvement. A previous trial of Levsin also did not help. She has long-standing crampy abdominal pain, bloating and diarrhea. She might have multiple loose stools over several days, then normal BMs for a few days. Her family states there is a significant anxiety trigger to it. After her last visit with me, she then sought consultation from a GI doctor in [facility] last October. I reviewed that note, the did some stool studies and recommended a lactose-free diet. She was at the urgent care with diarrhea about a week ago.  She has continued to have the same symptoms as before, but they seem worse in the last few months.  She has postprandial abdominal bloating with urgency for bowel movements, more so if she is eating or shopping.  She feels more comfortable and has fewer symptoms when she is at home.  There is occasional nocturnal diarrhea, and at times she feels urgency for bowel movement but nothing will happen. She is here with her husband and her English-speaking daughter.  Her daughter had not been present for previous visits, and neither the patient nor her husband seem to have any recollection of our previous conversations or her previous workup and diagnosis of IBS.  ROS: Cardiovascular:  no chest pain Respiratory: no dyspnea  The patient's Past Medical, Family and Social History were reviewed and are on file in the EMR.  Objective:  Med list reviewed  Current Outpatient Medications:  .  esomeprazole (NEXIUM) 40 MG  capsule, Take 40 mg by mouth daily before breakfast., Disp: , Rfl:  .  FLUoxetine (PROZAC) 20 MG capsule, Take 20 mg by mouth daily., Disp: , Rfl:  .  gabapentin (NEURONTIN) 300 MG capsule, Take 300 mg by mouth daily., Disp: , Rfl:  .  ibuprofen (ADVIL,MOTRIN) 800 MG tablet, Take 1 tablet (800 mg total) by mouth every 8 (eight) hours as needed., Disp: 21 tablet, Rfl: 0 .  levothyroxine (SYNTHROID, LEVOTHROID) 50 MCG tablet, , Disp: , Rfl:  .  loperamide (IMODIUM) 2 MG capsule, Take 1 capsule (2 mg total) by mouth 4 (four) times daily as needed for diarrhea or loose stools., Disp: 12 capsule, Rfl: 0 .  losartan-hydrochlorothiazide (HYZAAR) 100-12.5 MG tablet, Take 1 tablet by mouth daily., Disp: , Rfl:  .  metFORMIN (GLUCOPHAGE) 500 MG tablet, Take 500 mg by mouth daily with breakfast., Disp: , Rfl:  .  metoprolol tartrate (LOPRESSOR) 25 MG tablet, Take 25 mg by mouth daily., Disp: , Rfl:  .  mirtazapine (REMERON) 15 MG tablet, Take 15 mg by mouth at bedtime., Disp: , Rfl:  .  cholestyramine (QUESTRAN) 4 g packet, Take 1 packet (4 g total) by mouth daily with lunch., Disp: 60 each, Rfl: 1 .  Na Sulfate-K Sulfate-Mg Sulf 17.5-3.13-1.6 GM/177ML SOLN, Take 1 kit by mouth once for 1 dose., Disp: 354 mL, Rfl: 0   Vital signs in last 24 hrs: Vitals:   04/20/17 0833  BP: 110/70  Pulse: 72    Physical Exam  Pleasant, anxious appearing woman, good  muscle mass in no acute distress, well hydrated  HEENT: sclera anicteric, oral mucosa moist without lesions  Neck: supple, no thyromegaly, JVD or lymphadenopathy  Cardiac: RRR without murmurs, S1S2 heard, no peripheral edema  Pulm: clear to auscultation bilaterally, normal RR and effort noted  Abdomen: soft, no tenderness, with active bowel sounds. No guarding or palpable hepatosplenomegaly.  Long midline scar from cholecystectomy done in the distant past  Skin; warm and dry, no jaundice or rash  Recent Labs:  CBC Latest Ref Rng & Units  04/10/2017 03/06/2017 03/06/2016  WBC 3.4 - 10.8 x10E3/uL - 8.5 8.7  Hemoglobin 12.0 - 15.0 g/dL 13.3 11.9 13.0  Hematocrit 36.0 - 46.0 % 39.0 36.0 38.1  Platelets 150 - 379 x10E3/uL - 290 313   CMP Latest Ref Rng & Units 04/10/2017 03/06/2017 03/06/2016  Glucose 65 - 99 mg/dL 126(H) 127(H) 104(H)  BUN 6 - 20 mg/dL _0 Creatinine 0.44 - 1.00 mg/dL 0.70 0.85 0.75  Sodium 135 - 145 mmol/L 138 141 133(L)  Potassium 3.5 - 5.1 mmol/L 4.1 4.5 4.4  Chloride 101 - 111 mmol/L 101 103 98(L)  CO2 20 - 29 mmol/L - 21 27  Calcium 8.7 - 10.3 mg/dL - 9.4 9.3  Total Protein 6.0 - 8.5 g/dL - 7.1 7.2  Total Bilirubin 0.0 - 1.2 mg/dL - 0.3 0.8  Alkaline Phos 39 - 117 IU/L - 64 59  AST 0 - 40 IU/L - 23 27  ALT 0 - 32 IU/L - 21 21      _1 @ Assessment: Encounter Diagnoses  Name Primary?  . Chronic diarrhea Yes  . Abdominal bloating    I suspect this is still IBS.  She has had a previous extensive and unrevealing workup as noted above.  Perhaps she has developed something new in the meantime such as microscopic colitis, so I offered her a colonoscopy.  She is agreeable after discussion of procedure and risks.  The benefits and risks of the planned procedure were described in detail with the patient or (when appropriate) their health care proxy.  Risks were outlined as including, but not limited to, bleeding, infection, perforation, adverse medication reaction leading to cardiac or pulmonary decompensation, or pancreatitis (if ERCP).  The limitation of incomplete mucosal visualization was also discussed.  No guarantees or warranties were given.  She had no improvement after prior empiric treatment for SIBO, and had previously said that a trial of Levsin did not seem to help.  The only other therapy I think I have left to offer is a trial of once daily cholestyramine.  She will take it as a powder mixed in liquid once a day before lunch so it is at least a couple hours away from her  medicine doses.  If that does not seem to help, and colonoscopy and biopsies unrevealing, and I think continued use of antidiarrheal medicines and treatment of underlying anxiety would be the remaining options.  Total time 30 minutes, over half spent in counseling and coordination of care.  Additional time required due to language barrier.  Nelida Meuse III

## 2017-04-20 ENCOUNTER — Ambulatory Visit (INDEPENDENT_AMBULATORY_CARE_PROVIDER_SITE_OTHER): Payer: 59 | Admitting: Gastroenterology

## 2017-04-20 ENCOUNTER — Encounter: Payer: Self-pay | Admitting: Gastroenterology

## 2017-04-20 VITALS — BP 110/70 | HR 72 | Ht 63.0 in | Wt 169.0 lb

## 2017-04-20 DIAGNOSIS — K529 Noninfective gastroenteritis and colitis, unspecified: Secondary | ICD-10-CM

## 2017-04-20 DIAGNOSIS — R140 Abdominal distension (gaseous): Secondary | ICD-10-CM

## 2017-04-20 MED ORDER — NA SULFATE-K SULFATE-MG SULF 17.5-3.13-1.6 GM/177ML PO SOLN: 1.0000 | Freq: Once | ORAL | 0 refills | Status: AC

## 2017-04-20 MED ORDER — CHOLESTYRAMINE 4 G PO PACK: 4.0000 g | PACK | Freq: Every day | ORAL | 1 refills | Status: DC

## 2017-04-20 NOTE — Patient Instructions (Addendum)
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 Se trata de una serie de sntomas que afectan a los rganos que intervienen en la digestin (tubo gastrointestinal o tubo digestivo). Para regular la manera en que funciona el tubo digestivo, el cuerpo enva seales entre los intestinos y el cerebro. Si tiene SII, puede haber un problema con estas seales. Como consecuencia, el tubo digestivo no funciona con normalidad. Los intestinos pueden volverse ms sensibles y reaccionar de forma exagerada a determinadas cosas. Esto ocurre especialmente cuando se consumen determinados alimentos o cuando se est bajo estrs. Hay cuatro tipos de sndrome del intestino irritable que pueden determinarse en funcin de la consistencia de la materia fecal:  SII con diarrea.  SII con estreimiento.  SII mixto.  SII no tipificado. Es importante saber qu tipo de SII es el que se padece. Algunos tratamientos pueden ser ms tiles para ciertos tipos de SII. CAUSAS Se desconoce la causa exacta del SII. FACTORES DE RIESGO Puede tener un riesgo ms alto de tener SII si:  Es mujer.  Es menor de 45aos.  Tiene antecedentes familiares de SII.  Tiene problemas mentales.  Tuvo una infeccin bacteriana en el tubo digestivo. SIGNOS Y SNTOMAS Los sntomas de SII pueden variar de una persona a otra. El sntoma principal es el dolor o el malestar abdominal. Otros sntomas por lo general incluyen uno o ms de los siguientes:  Diarrea, estreimiento, o ambos.  Hinchazn abdominal o meteorismo.  Sensacin de saciedad o malestar despus de comer poco o una cantidad normal de comida.  Gases frecuentes.  Mucosidad en las heces.  Sensacin de tener ms heces por eliminar despus de defecar. Los sntomas suelen aparecer y desaparecer. Pueden estar asociados con estrs, enfermedades psiquitricas o con nada en  absoluto. DIAGNSTICO No hay un estudio especfico para diagnosticar el SII. El mdico har el diagnstico en funcin de un examen fsico, la historia clnica y los sntomas. Es posible que deban realizarle otros estudios para descartar otras enfermedades que pueden estar causando los sntomas. Estos pueden incluir lo siguiente:  Anlisis de sangre.  Radiografas.  Tomografa computarizada.  Endoscopia y colonoscopia. En este estudio se observa el tubo digestivo con un tubo largo, delgado y flexible. TRATAMIENTO No hay cura para el SII, pero el tratamiento puede ayudar a aliviar los sntomas. A menudo, el tratamiento para el SII incluye lo siguiente:  Cambios en la dieta, por ejemplo: ? Consumir ms fibra. ? Evitar los alimentos que causan sntomas. ? Beber ms agua. ? Consumir porciones normales de comida de tamao mediano.  Medicamentos. Estos pueden incluir lo siguiente: ? Suplementos de fibra si est estreido. ? Medicamentos para controlar la diarrea (antidiarreicos). ? Medicamentos para ayudar a controlar los espasmos musculares del tubo digestivo (antiespasmdicos). ? Medicamentos que ayuden con los problemas mentales, como antidepresivos o tranquilizantes.  Terapia. ? La psicoterapia puede ayudar con la ansiedad, la depresin u otros problemas de salud mental que pueden empeorar los sntomas de SII.  Reduccin del estrs. ? Controlar el estrs puede ayudar a mantener los sntomas bajo control. INSTRUCCIONES PARA EL CUIDADO EN EL HOGAR  Tome los medicamentos solamente como se lo haya indicado el mdico.  Consuma una dieta saludable. ? Evite los alimentos y las bebidas con azcar agregada. ? Incorpore gradualmente en la dieta una mayor cantidad de cereales integrales, frutas y verduras. Esto puede ser especialmente til si tiene SII con estreimiento. ? Evite los   alimentos y las bebidas que intensifican los sntomas. Estos pueden incluir productos lcteos y bebidas con  cafena o con gas. ? No coma comidas abundantes. ? Beba suficiente lquido para mantener la orina clara o de color amarillo plido.  Haga ejercicios regularmente. Pdale al mdico que le recomiende algunas actividades adecuadas para usted.  Concurra a todas las visitas de control como se lo haya indicado el mdico. Esto es importante. SOLICITE ATENCIN MDICA SI:  Engineer, maintenance (IT)Tiene dolor permanente.  Tiene dificultad o dolor al tragar.  Tiene diarrea que empeora. SOLICITE ATENCIN MDICA DE INMEDIATO SI:  Tiene dolor abdominal intenso y que empeora.  Tiene diarrea y alguno de los siguientes sntomas: ? Aparece una erupcin, rigidez en el cuello o dolor de cabeza intensos. ? Est irritable, somnoliento o le cuesta despertarse. ? Se siente dbil, mareado o tiene mucha sed.  Observa sangre de color rojo brillante en la materia fecal, o las heces son negras y de Geophysicist/field seismologistaspecto alquitranado.  Tiene hinchazn abdominal atpica que es dolorosa.  Vomita continuamente.  Vomita con sangre (hematemesis).  Tiene dolor abdominal y [place] Valley[place]. Esta informacin no tiene Theme park managercomo fin reemplazar el consejo del mdico. Asegrese de hacerle al mdico cualquier pregunta que tenga. Document Released: 02/06/2005 Document Revised: 02/27/2014 Document Reviewed: 10/24/2013 Elsevier Interactive Patient Education  [employer]2018 Elsevier Inc.   If you are age 79 or older, your body mass index should be between 23-30. Your Body mass index is 29.94 kg/m. If this is out of the aforementioned range listed, please consider follow up with your Primary Care Provider.  If you are age 79 or younger, your body mass index should be between 19-25. Your Body mass index is 29.94 kg/m. If this is out of the aformentioned range listed, please consider follow up with your Primary Care Provider.   You have been scheduled for a colonoscopy. Please follow written instructions given to you at your visit today.  Please pick up your prep supplies at the  pharmacy within the next 1-3 days. If you use inhalers (even only as needed), please bring them with you on the day of your procedure. Your physician has requested that you go to www.startemmi.com and enter the access code given to you at your visit today. This web site gives a general overview about your procedure. However, you should still follow specific instructions given to you by our office regarding your preparation for the procedure.  Thank you for choosing [facility]  Dr Amada JupiterHenry Danis III

## 2017-05-01 ENCOUNTER — Other Ambulatory Visit: Payer: Self-pay

## 2017-05-01 ENCOUNTER — Encounter: Payer: Self-pay | Admitting: Gastroenterology

## 2017-05-01 ENCOUNTER — Ambulatory Visit (AMBULATORY_SURGERY_CENTER): Payer: 59 | Admitting: Gastroenterology

## 2017-05-01 VITALS — BP 156/66 | HR 51 | Temp 98.7°F | Resp 20 | Ht 63.0 in | Wt 169.0 lb

## 2017-05-01 DIAGNOSIS — R1030 Lower abdominal pain, unspecified: Secondary | ICD-10-CM | POA: Diagnosis present

## 2017-05-01 DIAGNOSIS — K529 Noninfective gastroenteritis and colitis, unspecified: Secondary | ICD-10-CM

## 2017-05-01 DIAGNOSIS — R197 Diarrhea, unspecified: Secondary | ICD-10-CM | POA: Diagnosis not present

## 2017-05-01 MED ORDER — SODIUM CHLORIDE 0.9 % IV SOLN: 500.0000 mL | Freq: Once | INTRAVENOUS | Status: DC

## 2017-05-01 NOTE — Progress Notes (Signed)
Called to room to assist during endoscopic procedure.  Patient ID and intended procedure confirmed with present staff. Received instructions for my participation in the procedure from the performing physician.  

## 2017-05-01 NOTE — Progress Notes (Signed)
Spontaneous respirations throughout. VSS. Resting comfortably. To PACU on room air. Report to  RN. 

## 2017-05-01 NOTE — Op Note (Signed)
[facility] Patient Name: Heidi Preston Procedure Date: 05/01/2017 3:27 PM MRN: [id number] Endoscopist: Sherilyn Cooter L. Myrtie Neither , MD Age: 77 Referring MD:  Date of Birth: [birth year] Gender: Female Account #: [id number] Procedure:                Colonoscopy Indications:              Lower abdominal pain, Chronic diarrhea Medicines:                Monitored Anesthesia Care Procedure:                Pre-Anesthesia Assessment:                           - Prior to the procedure, a History and Physical                            was performed, and patient medications and                            allergies were reviewed. The patient's tolerance of                            previous anesthesia was also reviewed. The risks                            and benefits of the procedure and the sedation                            options and risks were discussed with the patient.                            All questions were answered, and informed consent                            was obtained. Prior Anticoagulants: The patient has                            taken no previous anticoagulant or antiplatelet                            agents. ASA Grade Assessment: II - A patient with                            mild systemic disease. After reviewing the risks                            and benefits, the patient was deemed in                            satisfactory condition to undergo the procedure.                           After obtaining informed consent, the colonoscope  was passed under direct vision. Throughout the                            procedure, the patient's blood pressure, pulse, and                            oxygen saturations were monitored continuously. The                            Colonoscope was introduced through the anus and                            advanced to the the terminal ileum. The colonoscopy                            was performed  without difficulty. The patient                            tolerated the procedure well. The quality of the                            bowel preparation was excellent. The terminal                            ileum, ileocecal valve, appendiceal orifice, and                            rectum were photographed. Scope In: 3:46:22 PM Scope Out: 4:02:32 PM Scope Withdrawal Time: 0 hours 13 minutes 37 seconds  Total Procedure Duration: 0 hours 16 minutes 10 seconds  Findings:                 The perianal and digital rectal examinations were                            normal.                           The terminal ileum appeared normal.                           Normal mucosa was found in the entire colon.                            Biopsies for histology were taken with a cold                            forceps from the right colon and left colon for                            evaluation of microscopic colitis.                           Multiple diverticula were found in the left colon.  The exam was otherwise without abnormality on                            direct and retroflexion views. Complications:            No immediate complications. Estimated Blood Loss:     Estimated blood loss was minimal. Impression:               - The examined portion of the ileum was normal.                           - Normal mucosa in the entire examined colon.                            Biopsied.                           - Diverticulosis in the left colon.                           - The examination was otherwise normal on direct                            and retroflexion views.                           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 Recommendation:           - Patient has a contact number available for                            emergencies.  The signs and symptoms of potential                            delayed complications were discussed with the                            patient. Return to normal activities tomorrow.                            Written discharge instructions were provided to the                            patient.                           - Resume previous diet.                           - Continue present medications.                           -  Await pathology results.                           - No recommendation at this time regarding repeat                            colonoscopy. Henry L. Myrtie Neither, MD 05/01/2017 4:10:15 PM This report has been signed electronically.

## 2017-05-01 NOTE — Patient Instructions (Addendum)
Food Guidelines for a sensitive stomach  Many people have difficulty digesting certain foods, causing a variety of distressing and embarrassing symptoms such as abdominal pain, bloating and gas.  These foods may need to be avoided or consumed in small amounts.  Here are some tips that might be helpful for you.  1.   Lactose intolerance is the difficulty or complete inability to digest lactose, the natural sugar in milk and anything made from milk.  This condition is harmless, common, and can begin any time during life.  Some people can digest a modest amount of lactose while others cannot tolerate any.  Also, not all dairy products contain equal amounts of lactose.  For example, hard cheeses such as parmesan have less lactose than soft cheeses such as cheddar.  Yogurt has less lactose than milk or cheese.  Many packaged foods (even many brands of bread) have milk, so read ingredient lists carefully.  It is difficult to test for lactose intolerance, so just try avoiding lactose as much as possible for a week and see what happens with your symptoms.  If you seem to be lactose intolerant, the best plan is to avoid it (but make sure you get calcium from another source).  The next best thing is to use lactase enzyme supplements, available over the counter everywhere.  Just know that many lactose intolerant people need to take several tablets with each serving of dairy to avoid symptoms.  Lastly, a lot of restaurant food is made with milk or butter.  Many are things you might not suspect, such as mashed potatoes, rice and pasta (cooked with butter) and "grilled" items.  If you are lactose intolerant, it never hurts to ask your server what has milk or butter.  2.   Fiber is an important part of your diet, but not all fiber is well-tolerated.  Insoluble fiber such as bran is often consumed by normal gut bacteria and converted into gas.  Soluble fiber such as oats, squash, carrots and green beans are typically  tolerated better.  3.   Some types of carbohydrates can be poorly digested.  Examples include: fructose (apples, cherries, pears, raisins and other dried fruits), fructans (onions, zucchini, large amounts of wheat), sorbitol/mannitol/xylitol and sucralose/Splenda (common artificial sweeteners), and raffinose (lentils, broccoli, cabbage, asparagus, brussel sprouts, many types of beans).  Do a Programmer, multimediaweb search for National CityFODMAP diet and you will find helpful information. Beano, a dietary supplement, will often help with raffinose-containing foods.  As with lactase tablets, you may need several per serving.  4.   Whenever possible, avoid processed food&meats and chemical additives.  High fructose corn syrup, a common sweetener, may be difficult to digest.  Eggs and soy (comes from the soybean, and added to many foods now) are other common bloating/gassy foods.  - Dr. Sherlynn CarbonHenry Danis [facility]   YOU HAD AN ENDOSCOPIC PROCEDURE TODAY AT THE [facility]:   Refer to the procedure report that was given to you for any specific questions about what was found during the examination.  If the procedure report does not answer your questions, please call your gastroenterologist to clarify.  If you requested that your care partner not be given the details of your procedure findings, then the procedure report has been included in a sealed envelope for you to review at your convenience later.  YOU SHOULD EXPECT: Some feelings of bloating in the abdomen. Passage of more gas than usual.  Walking can help get rid of the air  that was put into your GI tract during the procedure and reduce the bloating. If you had a lower endoscopy (such as a colonoscopy or flexible sigmoidoscopy) you may notice spotting of blood in your stool or on the toilet paper. If you underwent a bowel prep for your procedure, you may not have a normal bowel movement for a few days.  Please Note:  You might notice some irritation and  congestion in your nose or some drainage.  This is from the oxygen used during your procedure.  There is no need for concern and it should clear up in a day or so.  SYMPTOMS TO REPORT IMMEDIATELY:   Following lower endoscopy (colonoscopy or flexible sigmoidoscopy):  Excessive amounts of blood in the stool  Significant tenderness or worsening of abdominal pains  Swelling of the abdomen that is new, acute  Fever of 100F or higher  FFor urgent or emergent issues, a gastroenterologist can be reached at any hour by calling [phone].   DIET:  We do recommend a small meal at first, but then you may proceed to your regular diet.  Drink plenty of fluids but you should avoid alcoholic beverages for 24 hours.  ACTIVITY:  You should plan to take it easy for the rest of today and you should NOT DRIVE or use heavy machinery until tomorrow (because of the sedation medicines used during the test).    FOLLOW UP: Our staff will call the number listed on your records the next business day following your procedure to check on you and address any questions or concerns that you may have regarding the information given to you following your procedure. If we do not reach you, we will leave a message.  However, if you are feeling well and you are not experiencing any problems, there is no need to return our call.  We will assume that you have returned to your regular daily activities without incident.  If any biopsies were taken you will be contacted by phone or by letter within the next 1-3 weeks.  Please call us at [phone] if you have not heard about the biopsies in 3 weeks.    SIGNATURES/CONFIDENTIALITY: You and/or your care partner have signed paperwork which will be entered into your electronic medical record.  These signatures attest to the fact that that the information above on your After Visit Summary has been reviewed and is understood.  Full responsibility of the confidentiality of this  discharge information lies with you and/or your care-partner.

## 2017-05-02 ENCOUNTER — Telehealth: Payer: Self-pay

## 2017-05-02 NOTE — Telephone Encounter (Signed)
  Follow up Call-  Call back number 05/01/2017  Post procedure Call Back phone  # [phone] [place] daughter  Permission to leave phone message Yes  Some recent data might be hidden     Patient questions:  Do you have a fever, pain , or abdominal swelling? No. Pain Score  0 *  Have you tolerated food without any problems? Yes.    Have you been able to return to your normal activities? Yes.    Do you have any questions about your discharge instructions: Diet   No. Medications  No. Follow up visit  No.  Do you have questions or concerns about your Care? No.  Actions: * If pain score is 4 or above: No action needed, pain <4.

## 2017-05-11 ENCOUNTER — Encounter: Payer: Self-pay | Admitting: Gastroenterology

## 2017-05-15 ENCOUNTER — Ambulatory Visit: Payer: 59 | Admitting: Gastroenterology

## 2017-06-05 ENCOUNTER — Ambulatory Visit: Payer: 59 | Admitting: Emergency Medicine

## 2017-06-20 ENCOUNTER — Other Ambulatory Visit: Payer: Self-pay | Admitting: Gastroenterology

## 2017-06-25 ENCOUNTER — Encounter: Payer: Self-pay | Admitting: Family Medicine

## 2017-06-25 ENCOUNTER — Other Ambulatory Visit: Payer: Self-pay

## 2017-06-25 ENCOUNTER — Ambulatory Visit (INDEPENDENT_AMBULATORY_CARE_PROVIDER_SITE_OTHER): Payer: 59 | Admitting: Family Medicine

## 2017-06-25 VITALS — BP 105/68 | HR 53 | Temp 98.5°F | Resp 16 | Ht 63.0 in | Wt 163.0 lb

## 2017-06-25 DIAGNOSIS — I10 Essential (primary) hypertension: Secondary | ICD-10-CM

## 2017-06-25 DIAGNOSIS — Z5181 Encounter for therapeutic drug level monitoring: Secondary | ICD-10-CM | POA: Diagnosis not present

## 2017-06-25 DIAGNOSIS — J069 Acute upper respiratory infection, unspecified: Secondary | ICD-10-CM | POA: Diagnosis not present

## 2017-06-25 DIAGNOSIS — E119 Type 2 diabetes mellitus without complications: Secondary | ICD-10-CM

## 2017-06-25 LAB — GLUCOSE, POCT (MANUAL RESULT ENTRY): POC GLUCOSE: 106 mg/dL — AB (ref 70–99)

## 2017-06-25 NOTE — Progress Notes (Signed)
Chief Complaint  Patient presents with  . Cough    x 1 week, non productive, dry, constant episodes of coughing that cause her to have sob, chest rattling, cough is thick and hard and constant.  Pt is lethargic, fatigued not eating, no fever present.  Pt is taking tussin for cough with no relief.,    HPI  Patient's granddaughter translating  Acute URI Pt is a diabetic who also have hypertension on metformin, losartan, hctz Pt has been coughing for a week with sore throat, dry coughing fits with soreness in her chest from coughing The mucus that is produced is thick, hard but not a large quantity. She is very tired.  She has sore throat and poor appetite She reports that she has not been eating much and just feels very weak She is taking robitussin-D  Diabetes and Hypertension  She is a known diabetic who takes metformin She states that she has been having poor appetite but still taking her metformin dose She denies any hypoglycemia but does not check her sugars She also takes metoprolol Lab Results  Component Value Date   HGBA1C 6.1 03/06/2017   Her blood pressures were well controlled on medications She denies any chest pains, palpitations or lower extremity edema BP Readings from Last 3 Encounters:  06/25/17 105/68  05/01/17 (!) 156/66  04/20/17 110/70     Past Medical History:  Diagnosis Date  . Anxiety   . Arthritis   . Blepharitis, both eyes   . Esophagitis, reflux   . Fatigue   . Hypercholesteremia   . Hypertension   . Hypothyroid   . Neuromuscular disorder [facility])     Current Outpatient Medications  Medication Sig Dispense Refill  . cholestyramine (QUESTRAN) 4 g packet TAKE 1 PACKET (4 G TOTAL) BY MOUTH DAILY WITH LUNCH. 60 packet 0  . esomeprazole (NEXIUM) 40 MG capsule Take 40 mg by mouth daily before breakfast.    . FLUoxetine (PROZAC) 20 MG capsule Take 20 mg by mouth daily.    Marland Kitchen gabapentin (NEURONTIN) 300 MG capsule Take 300 mg by mouth daily.    Marland Kitchen  ibuprofen (ADVIL,MOTRIN) 800 MG tablet Take 1 tablet (800 mg total) by mouth every 8 (eight) hours as needed. 21 tablet 0  . levothyroxine (SYNTHROID, LEVOTHROID) 50 MCG tablet     . loperamide (IMODIUM) 2 MG capsule Take 1 capsule (2 mg total) by mouth 4 (four) times daily as needed for diarrhea or loose stools. 12 capsule 0  . metFORMIN (GLUCOPHAGE) 500 MG tablet Take 500 mg by mouth daily with breakfast.    . metoprolol tartrate (LOPRESSOR) 25 MG tablet Take 25 mg by mouth daily.    . mirtazapine (REMERON) 15 MG tablet Take 15 mg by mouth at bedtime.    Marland Kitchen losartan-hydrochlorothiazide (HYZAAR) 100-12.5 MG tablet Take 1 tablet by mouth daily.     Current Facility-Administered Medications  Medication Dose Route Frequency Provider Last Rate Last Dose  . 0.9 %  sodium chloride infusion  500 mL Intravenous Once Sherrilyn Rist, MD        Allergies:  Allergies  Allergen Reactions  . Pravastatin Other (See Comments)    Headache    Past Surgical History:  Procedure Laterality Date  . CHOLECYSTECTOMY     45 years ago  . SHOULDER SURGERY Right   . TOTAL ABDOMINAL HYSTERECTOMY  2012    Social History   Socioeconomic History  . Marital status: Married    Spouse name: Not  on file  . Number of children: 4  . Years of education: Not on file  . Highest education level: Not on file  Occupational History  . Occupation: housewife  Social Needs  . Financial resource strain: Not on file  . Food insecurity:    Worry: Not on file    Inability: Not on file  . Transportation needs:    Medical: Not on file    Non-medical: Not on file  Tobacco Use  . Smoking status: Never Smoker  . Smokeless tobacco: Never Used  Substance and Sexual Activity  . Alcohol use: No  . Drug use: No  . Sexual activity: Never  Lifestyle  . Physical activity:    Days per week: Not on file    Minutes per session: Not on file  . Stress: Not on file  Relationships  . Social connections:    Talks on phone:  Not on file    Gets together: Not on file    Attends religious service: Not on file    Active member of club or organization: Not on file    Attends meetings of clubs or organizations: Not on file    Relationship status: Not on file  Other Topics Concern  . Not on file  Social History Narrative  . Not on file    Family History  Problem Relation Age of Onset  . Colon cancer Neg Hx   . Stomach cancer Neg Hx   . Esophageal cancer Neg Hx   . Rectal cancer Neg Hx   . Liver cancer Neg Hx      ROS Review of Systems See HPI Constitution: No fevers or chills No malaise No diaphoresis Skin: No rash or itching Eyes: no blurry vision, no double vision GU: no dysuria or hematuria Neuro: no dizziness or headaches all others reviewed and negative   Objective: Vitals:   06/25/17 1123  BP: 105/68  Pulse: (!) 53  Resp: 16  Temp: 98.5 F (36.9 C)  TempSrc: Oral  SpO2: 97%  Weight: 163 lb (73.9 kg)  Height:  (1.6 m)    Physical Exam  Constitutional: She is oriented to person, place, and time. She appears well-developed and well-nourished.  HENT:  Head: Normocephalic and atraumatic.  Eyes: Conjunctivae and EOM are normal.  Neck: Normal range of motion. Neck supple.  Cardiovascular: Normal rate, regular rhythm and normal heart sounds.  No murmur heard. Pulmonary/Chest: Effort normal and breath sounds normal. No stridor. No respiratory distress.  Neurological: She is alert and oriented to person, place, and time.  Skin: Skin is warm. Capillary refill takes less than 2 seconds.  Psychiatric: She has a normal mood and affect. Her behavior is normal. Judgment and thought content normal.    Assessment and Plan Terianna was seen today for cough.  Diagnoses and all orders for this visit:  Controlled type 2 diabetes mellitus without complication, without long-term current use of insulin (HCC) -     POCT glucose (manual entry)  Acute URI  Medication monitoring  encounter  HYPERTENSION, BENIGN ESSENTIAL   Advised to stop metformin as well as cutting back on her bp medication losartan  Once she is eating regularly she should resume home meds Continue robitussin but stop the D and switch to regular robitussin  Palmyra Rogacki A Creta Levin

## 2017-06-25 NOTE — Patient Instructions (Addendum)
  Stop taking metformin for diabetes until you feel better and are eating again  Deje de tomar metformina para la diabetes hasta que se sienta mejor y vuelva a comer  ----------------------------------------------------------------------------------------------------  Stop taking losartan-hctz (hyzaar)  for blood pressure until you are eating again  Deje de tomar losartan-hctz (hyzaar) para la presin arterial hasta que vuelva a comer  ------------------------------------------------------------------------------------------------------    IF you received an x-ray today, you will receive an invoice from [facility]. Please contact [facility] at [phone] with questions or concerns regarding your invoice.   IF you received labwork today, you will receive an invoice from [facility]. Please contact [facility] at [phone] with questions or concerns regarding your invoice.   Our billing staff will not be able to assist you with questions regarding bills from these companies.  You will be contacted with the lab results as soon as they are available. The fastest way to get your results is to activate your My Chart account. Instructions are located on the last page of this paperwork. If you have not heard from [place] regarding the results in 2 weeks, please contact this office.

## 2017-07-11 ENCOUNTER — Telehealth: Payer: Self-pay | Admitting: Gastroenterology

## 2017-07-11 NOTE — Telephone Encounter (Signed)
Pt's husband called to inform that pt has been dealing with severe diarrhea and is very dehydrated to the point that she gets cramps, she cannot eat anything because food causes her diarrhea. Yesterday she went to the bathroom 8 times and was very dehydrated despite taking inmodium. Pt's husband needs some advise on what else to do. Pls call pt's daughter Quinn Plowman as pt's husband is not on pt's Hipp[employer].

## 2017-07-11 NOTE — Telephone Encounter (Signed)
Spoke to daughter, patient has been having diarrhea, weakness. She does take about 3 imodium daily and that helps. She denies any fever, no blood in the stools.

## 2017-07-12 ENCOUNTER — Other Ambulatory Visit: Payer: Self-pay

## 2017-07-12 DIAGNOSIS — R197 Diarrhea, unspecified: Secondary | ICD-10-CM

## 2017-07-12 NOTE — Telephone Encounter (Signed)
Spoke to patient's daughter, relayed Dr. Myrtie Neither' recommendations for medication changes and stool tests. Also need to stop nexium and switch to zantac. She did stop the metformin but is still taking the losartan/hctz. The daughter will touchbase with Dr. Creta Levin about possible changes.

## 2017-07-12 NOTE — Telephone Encounter (Signed)
Sorry to hear she is not feeling well.  She has had an extensive and unrevealing workup for this so far.  I see that she was recently at primary care for a cough, but does not appear to have received antibiotics. She was advised to stop metformin and decrease losartan/hctz.  Plan:   GI pathogen panel  Fecal elastase   Stop taking nexium (esomeprazole) Use generic zantac as needed for heartburn.   I will copy this to her PCP Dr. Creta Levin because I think it worth a switch from the losartan/hctz to a different class of blood pressure medicine.  The losartan can cause chronic diarrhea.  - HD

## 2017-07-18 ENCOUNTER — Other Ambulatory Visit: Payer: 59

## 2017-07-19 ENCOUNTER — Other Ambulatory Visit: Payer: 59

## 2017-07-19 DIAGNOSIS — R197 Diarrhea, unspecified: Secondary | ICD-10-CM

## 2017-07-25 LAB — GASTROINTESTINAL PATHOGEN PANEL PCR
C. difficile Tox A/B, PCR: NOT DETECTED
CAMPYLOBACTER, PCR: NOT DETECTED
Cryptosporidium, PCR: NOT DETECTED
E coli (ETEC) LT/ST PCR: NOT DETECTED
E coli (STEC) stx1/stx2, PCR: NOT DETECTED
E coli 0157, PCR: NOT DETECTED
Giardia lamblia, PCR: NOT DETECTED
Norovirus, PCR: NOT DETECTED
ROTAVIRUS, PCR: NOT DETECTED
SALMONELLA, PCR: NOT DETECTED
Shigella, PCR: NOT DETECTED

## 2017-07-25 LAB — PANCREATIC ELASTASE, FECAL: Pancreatic Elastase-1, Stool: >500 mcg/g

## 2017-10-10 ENCOUNTER — Other Ambulatory Visit: Payer: Self-pay

## 2017-10-10 ENCOUNTER — Ambulatory Visit (INDEPENDENT_AMBULATORY_CARE_PROVIDER_SITE_OTHER): Payer: 59 | Admitting: Physician Assistant

## 2017-10-10 ENCOUNTER — Encounter: Payer: Self-pay | Admitting: Physician Assistant

## 2017-10-10 VITALS — BP 158/88 | HR 59 | Temp 98.6°F | Resp 18 | Ht 63.0 in | Wt 171.0 lb

## 2017-10-10 DIAGNOSIS — R21 Rash and other nonspecific skin eruption: Secondary | ICD-10-CM

## 2017-10-10 DIAGNOSIS — L299 Pruritus, unspecified: Secondary | ICD-10-CM

## 2017-10-10 MED ORDER — TRIAMCINOLONE ACETONIDE 0.5 % EX OINT: 1.0000 "application " | TOPICAL_OINTMENT | Freq: Two times a day (BID) | CUTANEOUS | 0 refills | Status: DC

## 2017-10-10 MED ORDER — HYDROXYZINE HCL 25 MG PO TABS: 25.0000 mg | ORAL_TABLET | Freq: Three times a day (TID) | ORAL | 0 refills | Status: DC | PRN

## 2017-10-10 NOTE — Patient Instructions (Addendum)
usted no tiene herpes zster  Te estoy tratando hoy por una reaccin alrgica Aplicar crema de kenalog en la zona 1-2 veces al da durante 1-2 semanas La hidroxizina es para Engineer, agricultural. Tome esto segn sea necesario.  volver en 5-7 das si usted no est mejorando ---------------------------------------------------------------------------------------------------- Apply kenalog cream to the area 1-2 times/daily for 1-2 weeks Hydroxyzine is for itching. Take this as needed.   Thank you for coming in today. I hope you feel we met your needs.  Feel free to call PCP if you have any questions or further requests.  Please consider signing up for MyChart if you do not already have it, as this is a great way to communicate with me.  Best,  Whitney McVey, PA-C  IF you received an x-ray today, you will receive an invoice from [facility]. Please contact [facility] at [phone] with questions or concerns regarding your invoice.   IF you received labwork today, you will receive an invoice from [place]. Please contact [facility] at [phone] with questions or concerns regarding your invoice.   Our billing staff will not be able to assist you with questions regarding bills from these companies.  You will be contacted with the lab results as soon as they are available. The fastest way to get your results is to activate your My Chart account. Instructions are located on the last page of this paperwork. If you have not heard from [place] regarding the results in 2 weeks, please contact this office.

## 2017-10-10 NOTE — Progress Notes (Signed)
Heidi Preston  MRN: [id number] DOB: [birth year]  PCP: Heidi Preston, Heidi Jose, MD  Subjective:  Pt is a 77 year old female who presents to clinic for rash on her back x 4 days. She is here today with her husband. Speaks spanish, mobile interpreter used today #[id number] Endorses itching.  She has used otc hydrocortisone cream Denies pain, drainage, fever, chills  Review of Systems  Constitutional: Negative for chills, diaphoresis, fatigue and fever.  Gastrointestinal: Negative for abdominal pain, nausea and vomiting.  Skin: Positive for rash.    Patient Active Problem List   Diagnosis Date Noted  . Diarrhea of presumed infectious origin 04/10/2017  . Dehydration 04/10/2017  . Generalized abdominal pain 04/10/2017  . General weakness 04/10/2017  . History of depression 03/06/2017  . History of hypothyroidism 03/06/2017  . History of hyperglycemia 03/06/2017  . Chest pain 05/04/2012  . Urinary incontinence 05/04/2012  . Pulmonary nodule 05/25/2011  . Chronic cough 10/05/2009  . KNEE PAIN, LEFT 05/19/2009  . INSOMNIA 03/08/2009  . HYPERTENSION, BENIGN ESSENTIAL 02/17/2009  . PAIN IN JOINT, MULTIPLE SITES 02/17/2009  . OSTEOARTHRITIS 11/23/2008  . POSTHERPETIC NEURALGIA 09/04/2008  . 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 UTERINE PROLAPSE 01/01/2008  . Hypothyroidism 09/01/2006  . 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 OBESITY NOS 09/01/2006  . DEPRESSION 09/01/2006  . GASTROESOPHAGEAL REFLUX DISEASE 09/01/2006  . VAGINITIS, ATROPHIC 09/01/2006  . 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 Headache(784.0) 09/01/2006  . HYPERGLYCEMIA 09/01/2006  . ABNORMAL RESULT, FUNCTION STUDY, LIVER 09/01/2006    Current Outpatient Medications on File Prior to Visit  Medication Sig Dispense Refill  . cholestyramine (QUESTRAN) 4 g packet TAKE 1 PACKET (4 G TOTAL) BY MOUTH DAILY WITH LUNCH. 60 packet 0  . esomeprazole (NEXIUM) 40 MG capsule Take 40 mg by mouth daily before breakfast.    . FLUoxetine  (PROZAC) 20 MG capsule Take 20 mg by mouth daily.    Marland Kitchen. gabapentin (NEURONTIN) 300 MG capsule Take 300 mg by mouth daily.    Marland Kitchen. ibuprofen (ADVIL,MOTRIN) 800 MG tablet Take 1 tablet (800 mg total) by mouth every 8 (eight) hours as needed. 21 tablet 0  . levothyroxine (SYNTHROID, LEVOTHROID) 50 MCG tablet     . loperamide (IMODIUM) 2 MG capsule Take 1 capsule (2 mg total) by mouth 4 (four) times daily as needed for diarrhea or loose stools. 12 capsule 0  . losartan-hydrochlorothiazide (HYZAAR) 100-12.5 MG tablet Take 1 tablet by mouth daily.    . metFORMIN (GLUCOPHAGE) 500 MG tablet Take 500 mg by mouth daily with breakfast.    . metoprolol tartrate (LOPRESSOR) 25 MG tablet Take 25 mg by mouth daily.    . mirtazapine (REMERON) 15 MG tablet Take 15 mg by mouth at bedtime.     Current Facility-Administered Medications on File Prior to Visit  Medication Dose Route Frequency Provider Last Rate Last Dose  . 0.9 %  sodium chloride infusion  500 mL Intravenous Once Sherrilyn Ristanis, Henry L III, MD        Allergies  Allergen Reactions  . Pravastatin Other (See Comments)    Headache     Objective:  BP (!) 158/88   Pulse (!) 59   Temp 98.6 F (37 C) (Oral)   Resp 18   Ht 5\' 3"  (1.6 m)   Wt 171 lb (77.6 kg)   SpO2 98%   BMI 30.29 kg/m   Physical Exam  Constitutional: She is oriented to person, place, and time. No distress.  Neurological: She is  alert and oriented to person, place, and time.  Skin: Skin is warm and dry. Rash noted. Rash is urticarial.     Psychiatric: Judgment normal.  Vitals reviewed.   Assessment and Plan :  1. Rash and nonspecific skin eruption - triamcinolone ointment (KENALOG) 0.5 %; Apply 1 application topically 2 (two) times daily.  Dispense: 30 g; Refill: 0  2. Itching - hydrOXYzine (ATARAX/VISTARIL) 25 MG tablet; Take 1 tablet (25 mg total) by mouth every 8 (eight) hours as needed for itching.  Dispense: 30 tablet; Refill: 0   Marco CollieWhitney Melburn Treiber, PA-C  Primary Care at  [facility] 10/10/2017 2:58 PM  Please note: Portions of this report may have been transcribed using dragon voice recognition software. Every effort was made to ensure accuracy; however, inadvertent computerized transcription errors may be present.

## 2018-04-15 ENCOUNTER — Other Ambulatory Visit: Payer: Self-pay | Admitting: Orthopedic Surgery

## 2018-04-15 DIAGNOSIS — M25561 Pain in right knee: Secondary | ICD-10-CM

## 2018-04-18 ENCOUNTER — Ambulatory Visit
Admission: RE | Admit: 2018-04-18 | Discharge: 2018-04-18 | Disposition: A | Discharge status: Home / Self Care | Payer: 59 | Source: Ambulatory Visit | Attending: Orthopedic Surgery | Admitting: Orthopedic Surgery

## 2018-04-18 DIAGNOSIS — M25561 Pain in right knee: Secondary | ICD-10-CM

## 2018-05-16 ENCOUNTER — Encounter (HOSPITAL_COMMUNITY): Payer: Self-pay

## 2018-05-16 ENCOUNTER — Emergency Department (HOSPITAL_COMMUNITY)
Admission: EM | Admit: 2018-05-16 | Discharge: 2018-05-16 | Disposition: A | Discharge status: Home / Self Care | Payer: 59 | Attending: Emergency Medicine | Admitting: Emergency Medicine

## 2018-05-16 ENCOUNTER — Emergency Department (HOSPITAL_COMMUNITY): Payer: 59

## 2018-05-16 ENCOUNTER — Other Ambulatory Visit: Payer: Self-pay

## 2018-05-16 DIAGNOSIS — R531 Weakness: Secondary | ICD-10-CM | POA: Insufficient documentation

## 2018-05-16 DIAGNOSIS — R197 Diarrhea, unspecified: Secondary | ICD-10-CM | POA: Insufficient documentation

## 2018-05-16 DIAGNOSIS — R112 Nausea with vomiting, unspecified: Secondary | ICD-10-CM | POA: Insufficient documentation

## 2018-05-16 DIAGNOSIS — E039 Hypothyroidism, unspecified: Secondary | ICD-10-CM | POA: Insufficient documentation

## 2018-05-16 DIAGNOSIS — Z79899 Other long term (current) drug therapy: Secondary | ICD-10-CM | POA: Diagnosis not present

## 2018-05-16 DIAGNOSIS — I10 Essential (primary) hypertension: Secondary | ICD-10-CM | POA: Diagnosis not present

## 2018-05-16 DIAGNOSIS — Z7984 Long term (current) use of oral hypoglycemic drugs: Secondary | ICD-10-CM | POA: Diagnosis not present

## 2018-05-16 LAB — COMPREHENSIVE METABOLIC PANEL
ALT: 21 U/L (ref 0–44)
AST: 25 U/L (ref 15–41)
Albumin: 4.1 g/dL (ref 3.5–5.0)
Alkaline Phosphatase: 59 U/L (ref 38–126)
Anion gap: 16 — ABNORMAL HIGH (ref 5–15)
BUN: 23 mg/dL (ref 8–23)
CHLORIDE: 103 mmol/L (ref 98–111)
CO2: 18 mmol/L — ABNORMAL LOW (ref 22–32)
Calcium: 9.4 mg/dL (ref 8.9–10.3)
Creatinine, Ser: 0.9 mg/dL (ref 0.44–1.00)
GFR calc Af Amer: >60 mL/min (ref >60)
GFR calc non Af Amer: >60 mL/min (ref >60)
Glucose, Bld: 156 mg/dL — ABNORMAL HIGH (ref 70–99)
Potassium: 3.8 mmol/L (ref 3.5–5.1)
SODIUM: 137 mmol/L (ref 135–145)
Total Bilirubin: 0.7 mg/dL (ref 0.3–1.2)
Total Protein: 7.8 g/dL (ref 6.5–8.1)

## 2018-05-16 LAB — CBC
HCT: 35.9 % — ABNORMAL LOW (ref 36.0–46.0)
Hemoglobin: 12 g/dL (ref 12.0–15.0)
MCH: 31.5 pg (ref 26.0–34.0)
MCHC: 33.4 g/dL (ref 30.0–36.0)
MCV: 94.2 fL (ref 80.0–100.0)
Platelets: 253 10*3/uL (ref 150–400)
RBC: 3.81 MIL/uL — ABNORMAL LOW (ref 3.87–5.11)
RDW: 13.1 % (ref 11.5–15.5)
WBC: 8.8 10*3/uL (ref 4.0–10.5)
nRBC: 0 % (ref 0.0–0.2)

## 2018-05-16 LAB — URINALYSIS, ROUTINE W REFLEX MICROSCOPIC
Bacteria, UA: NONE SEEN
Bilirubin Urine: NEGATIVE
Glucose, UA: NEGATIVE mg/dL
Hgb urine dipstick: NEGATIVE
Ketones, ur: 5 mg/dL — AB
Leukocytes,Ua: NEGATIVE
NITRITE: NEGATIVE
Protein, ur: 100 mg/dL — AB
Specific Gravity, Urine: 1.025 (ref 1.005–1.030)
pH: 6 (ref 5.0–8.0)

## 2018-05-16 LAB — CBG MONITORING, ED: Glucose-Capillary: 145 mg/dL — ABNORMAL HIGH (ref 70–99)

## 2018-05-16 LAB — TSH: TSH: 1.261 u[IU]/mL (ref 0.350–4.500)

## 2018-05-16 LAB — LIPASE, BLOOD: Lipase: 46 U/L (ref 11–51)

## 2018-05-16 MED ORDER — SODIUM CHLORIDE 0.9% FLUSH
3.0000 mL | Freq: Once | INTRAVENOUS | Status: AC
  Administered 2018-05-16: 3 mL via INTRAVENOUS

## 2018-05-16 MED ORDER — ONDANSETRON HCL 4 MG/2ML IJ SOLN
4.0000 mg | Freq: Once | INTRAMUSCULAR | Status: AC
  Administered 2018-05-16: 4 mg via INTRAVENOUS
  Filled 2018-05-16: qty 2

## 2018-05-16 MED ORDER — ONDANSETRON 4 MG PO TBDP: 4.0000 mg | ORAL_TABLET | Freq: Three times a day (TID) | ORAL | 0 refills | Status: DC | PRN

## 2018-05-16 NOTE — ED Provider Notes (Signed)
[facility]-EMERGENCY DEPT Provider Note   CSN: [id number] Arrival date & time: 05/16/18  1142    History   Chief Complaint No chief complaint on file.   HPI Heidi Preston is a 78 y.o. female.     Level 5 caveat due to unresponsiveness.  Patient reportedly speaks Spanish primarily but had no response to Engineer, structural. HPI Patient reportedly sent in for nausea vomiting and diarrhea for 3 days.  Reportedly has no fever abdominal pain.  However on my examination patient will not speak to me.  She is quietly moaning.  Will moan louder if I do with sternal rub and she will lift her arms up, however will still not speak to me.  Breathing spontaneously. Past Medical History:  Diagnosis Date  . Anxiety   . Arthritis   . Blepharitis, both eyes   . Esophagitis, reflux   . Fatigue   . Hypercholesteremia   . Hypertension   . Hypothyroid   . Neuromuscular disorder [facility])     Patient Active Problem List   Diagnosis Date Noted  . Diarrhea of presumed infectious origin 04/10/2017  . Dehydration 04/10/2017  . Generalized abdominal pain 04/10/2017  . General weakness 04/10/2017  . History of depression 03/06/2017  . History of hypothyroidism 03/06/2017  . History of hyperglycemia 03/06/2017  . Chest pain 05/04/2012  . Urinary incontinence 05/04/2012  . Pulmonary nodule 05/25/2011  . Chronic cough 10/05/2009  . KNEE PAIN, LEFT 05/19/2009  . INSOMNIA 03/08/2009  . HYPERTENSION, BENIGN ESSENTIAL 02/17/2009  . PAIN IN JOINT, MULTIPLE SITES 02/17/2009  . OSTEOARTHRITIS 11/23/2008  . POSTHERPETIC NEURALGIA 09/04/2008  . 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 UTERINE PROLAPSE 01/01/2008  . Hypothyroidism 09/01/2006  . 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 OBESITY NOS 09/01/2006  . DEPRESSION 09/01/2006  . GASTROESOPHAGEAL REFLUX DISEASE 09/01/2006  . VAGINITIS, ATROPHIC 09/01/2006  . 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 Headache(784.0) 09/01/2006  . HYPERGLYCEMIA  09/01/2006  . ABNORMAL RESULT, FUNCTION STUDY, LIVER 09/01/2006    Past Surgical History:  Procedure Laterality Date  . CHOLECYSTECTOMY     45 years ago  . SHOULDER SURGERY Right   . TOTAL ABDOMINAL HYSTERECTOMY  2012     OB History   No obstetric history on file.      Home Medications    Prior to Admission medications   Medication Sig Start Date End Date Taking? Authorizing Provider  cholestyramine (QUESTRAN) 4 g packet TAKE 1 PACKET (4 G TOTAL) BY MOUTH DAILY WITH LUNCH. 06/20/17   Danis, Andreas Blower, MD  esomeprazole (NEXIUM) 40 MG capsule Take 40 mg by mouth daily before breakfast.    [provider]  FLUoxetine (PROZAC) 20 MG capsule Take 20 mg by mouth daily.    [provider]  gabapentin (NEURONTIN) 300 MG capsule Take 300 mg by mouth daily.    [provider]  hydrOXYzine (ATARAX/VISTARIL) 25 MG tablet Take 1 tablet (25 mg total) by mouth every 8 (eight) hours as needed for itching. 10/10/17   McVey, Madelaine Bhat, PA-C  ibuprofen (ADVIL,MOTRIN) 800 MG tablet Take 1 tablet (800 mg total) by mouth every 8 (eight) hours as needed. 03/06/16   Lawyer, Cristal Deer, PA-C  levothyroxine (SYNTHROID, LEVOTHROID) 50 MCG tablet  11/15/15   [provider]  loperamide (IMODIUM) 2 MG capsule Take 1 capsule (2 mg total) by mouth 4 (four) times daily as needed for diarrhea or loose stools. 04/10/17   Georgetta Haber, NP  losartan-hydrochlorothiazide (HYZAAR) 100-12.5  MG tablet Take 1 tablet by mouth daily.    [provider]  metFORMIN (GLUCOPHAGE) 500 MG tablet Take 500 mg by mouth daily with breakfast.    [provider]  metoprolol tartrate (LOPRESSOR) 25 MG tablet Take 25 mg by mouth daily.    [provider]  mirtazapine (REMERON) 15 MG tablet Take 15 mg by mouth at bedtime.    [provider]  ondansetron (ZOFRAN-ODT) 4 MG disintegrating tablet Take 1 tablet (4 mg total) by mouth every 8 (eight) hours as needed  for nausea or vomiting. 05/16/18   Benjiman Core, MD  triamcinolone ointment (KENALOG) 0.5 % Apply 1 application topically 2 (two) times daily. 10/10/17   McVey, Madelaine Bhat, PA-C    Family History Family History  Problem Relation Age of Onset  . Colon cancer Neg Hx   . Stomach cancer Neg Hx   . Esophageal cancer Neg Hx   . Rectal cancer Neg Hx   . Liver cancer Neg Hx     Social History Social History   Tobacco Use  . Smoking status: Never Smoker  . Smokeless tobacco: Never Used  Substance Use Topics  . Alcohol use: No  . Drug use: No     Allergies   Pravastatin   Review of Systems Review of Systems   Physical Exam Updated Vital Signs BP (!) 167/66   Pulse 61   Temp 98.6 F (37 C) (Oral)   Resp (!) 31   Ht 5\' 3"  (1.6 m)   Wt 80.3 kg   SpO2 100%   BMI 31.35 kg/m   Physical Exam   ED Treatments / Results  Labs (all labs ordered are listed, but only abnormal results are displayed) Labs Reviewed  COMPREHENSIVE METABOLIC PANEL - Abnormal; Notable for the following components:      Result Value   CO2 18 (*)    Glucose, Bld 156 (*)    Anion gap 16 (*)    All other components within normal limits  CBC - Abnormal; Notable for the following components:   RBC 3.81 (*)    HCT 35.9 (*)    All other components within normal limits  URINALYSIS, ROUTINE W REFLEX MICROSCOPIC - Abnormal; Notable for the following components:   APPearance HAZY (*)    Ketones, ur 5 (*)    Protein, ur 100 (*)    All other components within normal limits  CBG MONITORING, ED - Abnormal; Notable for the following components:   Glucose-Capillary 145 (*)    All other components within normal limits  LIPASE, BLOOD  TSH    EKG None  Radiology Ct Head Wo Contrast  Result Date: 05/16/2018 CLINICAL DATA:  Nausea, vomiting and diarrhea for 3 days. No fever or abdominal pain. EXAM: CT HEAD WITHOUT CONTRAST TECHNIQUE: Contiguous axial images were obtained from the base of the  skull through the vertex without intravenous contrast. COMPARISON:  None. FINDINGS: Brain: No evidence for acute infarction, hemorrhage, mass lesion, hydrocephalus, or extra-axial fluid. Mild cerebral and cerebellar atrophy. Hypoattenuation of white matter, likely small vessel disease. Vascular: Calcification of the cavernous internal carotid arteries consistent with cerebrovascular atherosclerotic disease. No signs of intracranial large vessel occlusion. Skull: Normal. Negative for fracture or focal lesion. Sinuses/Orbits: No acute finding. Other: None. IMPRESSION: Atrophy and small vessel disease.  No acute intracranial findings. Electronically Signed   By: Elsie Stain M.D.   On: 05/16/2018 13:32    Procedures Procedures (including critical care time)  Medications Ordered  in ED Medications  sodium chloride flush (NS) 0.9 % injection 3 mL (3 mLs Intravenous Given 05/16/18 1247)  ondansetron (ZOFRAN) injection 4 mg (4 mg Intravenous Given 05/16/18 1303)     Initial Impression / Assessment and Plan / ED Course  I have reviewed the triage vital signs and the nursing notes.  Pertinent labs & imaging results that were available during my care of the patient were reviewed by me and considered in my medical decision making (see chart for details).        Patient with nausea vomiting and diarrhea.  Patient feels much better now.  Awake and at baseline.  States when she feels bad she goes unresponsive.  Patient not appear to have any arrhythmia or seizure-like activity.  I think the mental status changes are likely functional.  Will discharge home with antiemetic and patient already has Imodium at home.  Well-appearing.  Benign abdominal exam.  Final Clinical Impressions(s) / ED Diagnoses   Final diagnoses:  Nausea vomiting and diarrhea    ED Discharge Orders         Ordered    ondansetron (ZOFRAN-ODT) 4 MG disintegrating tablet  Every 8 hours PRN     05/16/18 1414           Benjiman Core, MD 05/16/18 1415

## 2018-05-16 NOTE — ED Triage Notes (Signed)
Patient is Spanish speaking. Patient c/o N/v/D x 3 days. Patient denies fever or abdominal pain.

## 2018-05-16 NOTE — Discharge Instructions (Signed)
Take the Imodium you have at home as needed for the diarrhea.

## 2018-06-16 IMAGING — CR DG CHEST 2V
2 series · 2 of 2 positions shown · non-contrast
Comparison: 12/25/2014

CLINICAL DATA: Body aches, cough and shortness of breath.

EXAM:
CHEST  2 VIEW

[w chest pa]
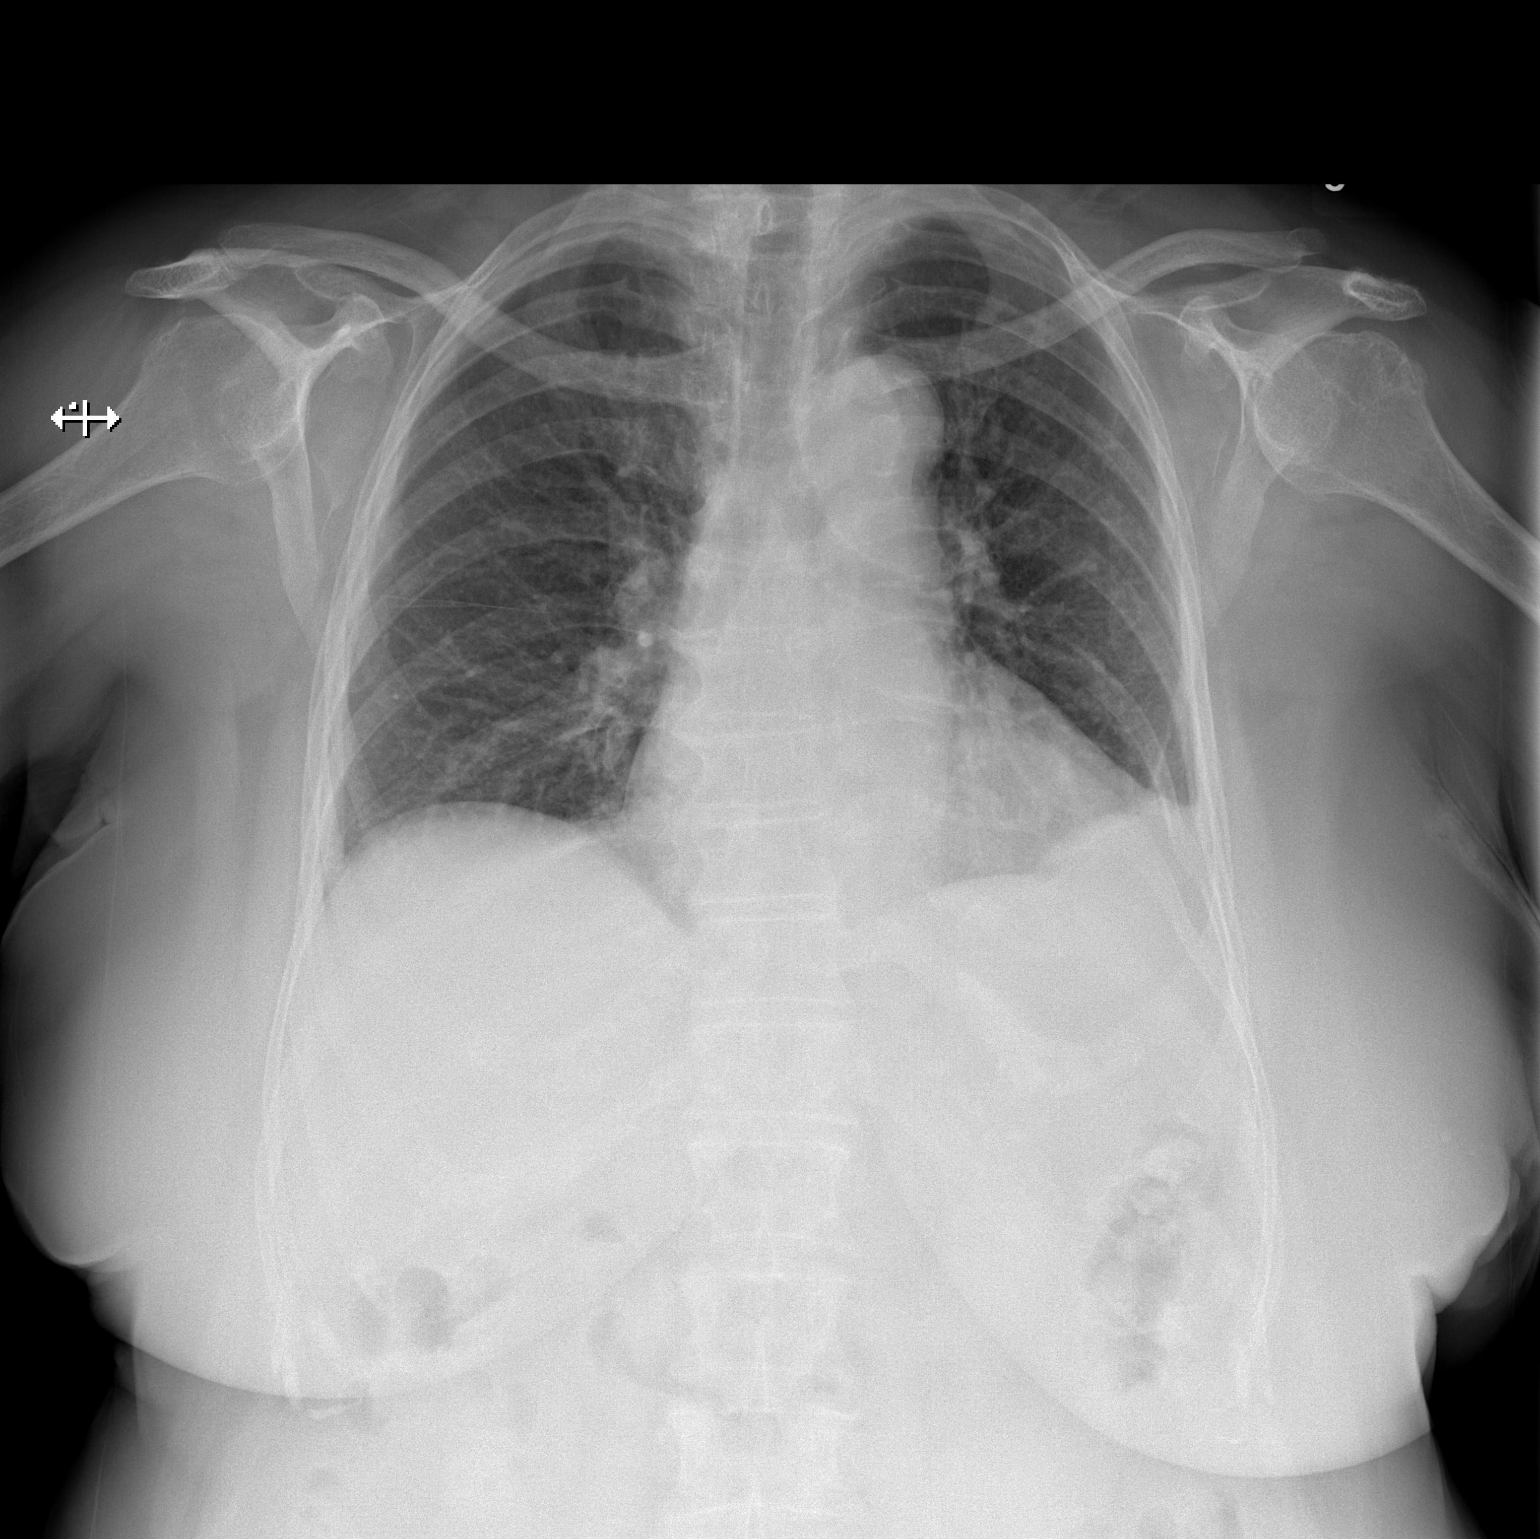

[w chest lat]
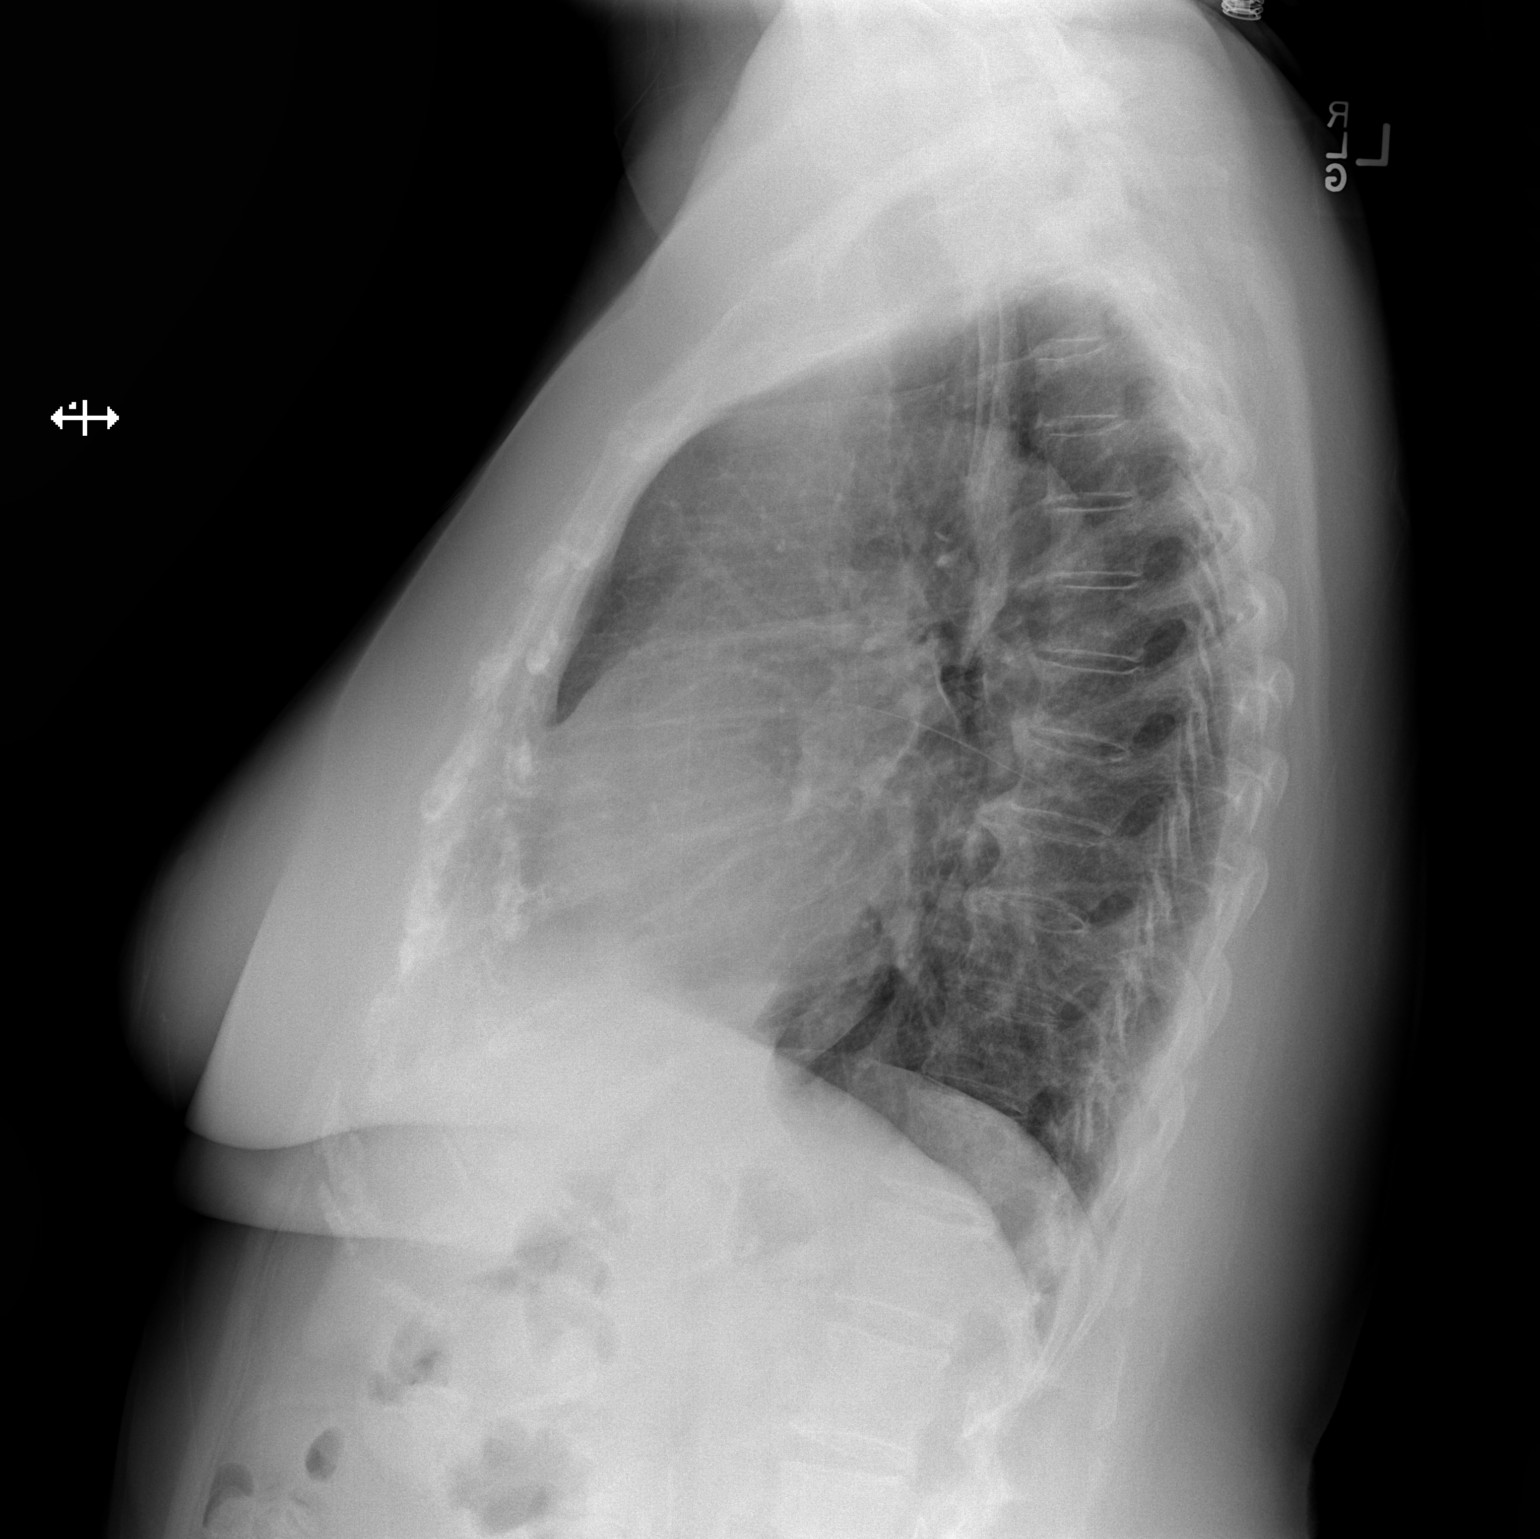

[2 of 2 positions shown; findings below may reference images not displayed]

FINDINGS: The heart size and mediastinal contours are within normal limits.
Aortic atherosclerosis noted. Both lungs are clear. The visualized
skeletal structures are unremarkable.
IMPRESSION: No active cardiopulmonary disease.

## 2018-07-29 ENCOUNTER — Other Ambulatory Visit: Payer: Self-pay

## 2018-07-29 ENCOUNTER — Emergency Department (HOSPITAL_COMMUNITY): Payer: 59

## 2018-07-29 ENCOUNTER — Emergency Department (HOSPITAL_COMMUNITY)
Admission: EM | Admit: 2018-07-29 | Discharge: 2018-07-29 | Disposition: A | Discharge status: Home / Self Care | Payer: 59 | Attending: Emergency Medicine | Admitting: Emergency Medicine

## 2018-07-29 ENCOUNTER — Encounter (HOSPITAL_COMMUNITY): Payer: Self-pay

## 2018-07-29 ENCOUNTER — Ambulatory Visit (INDEPENDENT_AMBULATORY_CARE_PROVIDER_SITE_OTHER)
Admission: EM | Admit: 2018-07-29 | Discharge: 2018-07-29 | Disposition: A | Discharge status: Home / Self Care | Payer: 59 | Source: Home / Self Care | Attending: Family Medicine | Admitting: Family Medicine

## 2018-07-29 ENCOUNTER — Encounter (HOSPITAL_COMMUNITY): Payer: Self-pay | Admitting: Emergency Medicine

## 2018-07-29 DIAGNOSIS — R42 Dizziness and giddiness: Secondary | ICD-10-CM

## 2018-07-29 DIAGNOSIS — Z79899 Other long term (current) drug therapy: Secondary | ICD-10-CM | POA: Insufficient documentation

## 2018-07-29 DIAGNOSIS — R1084 Generalized abdominal pain: Secondary | ICD-10-CM | POA: Diagnosis not present

## 2018-07-29 DIAGNOSIS — E039 Hypothyroidism, unspecified: Secondary | ICD-10-CM | POA: Insufficient documentation

## 2018-07-29 DIAGNOSIS — Z7984 Long term (current) use of oral hypoglycemic drugs: Secondary | ICD-10-CM | POA: Diagnosis not present

## 2018-07-29 DIAGNOSIS — E119 Type 2 diabetes mellitus without complications: Secondary | ICD-10-CM | POA: Diagnosis not present

## 2018-07-29 DIAGNOSIS — R1030 Lower abdominal pain, unspecified: Secondary | ICD-10-CM | POA: Diagnosis present

## 2018-07-29 DIAGNOSIS — I10 Essential (primary) hypertension: Secondary | ICD-10-CM | POA: Diagnosis not present

## 2018-07-29 HISTORY — DX: Type 2 diabetes mellitus without complications: E11.9

## 2018-07-29 LAB — CBC
HCT: 37 % (ref 36.0–46.0)
Hemoglobin: 12.1 g/dL (ref 12.0–15.0)
MCH: 31.2 pg (ref 26.0–34.0)
MCHC: 32.7 g/dL (ref 30.0–36.0)
MCV: 95.4 fL (ref 80.0–100.0)
Platelets: 276 10*3/uL (ref 150–400)
RBC: 3.88 MIL/uL (ref 3.87–5.11)
RDW: 12.7 % (ref 11.5–15.5)
WBC: 8.9 10*3/uL (ref 4.0–10.5)
nRBC: 0 % (ref 0.0–0.2)

## 2018-07-29 LAB — COMPREHENSIVE METABOLIC PANEL
ALT: 28 U/L (ref 0–44)
AST: 30 U/L (ref 15–41)
Albumin: 3.8 g/dL (ref 3.5–5.0)
Alkaline Phosphatase: 56 U/L (ref 38–126)
Anion gap: 12 (ref 5–15)
BUN: 12 mg/dL (ref 8–23)
CO2: 25 mmol/L (ref 22–32)
Calcium: 9.7 mg/dL (ref 8.9–10.3)
Chloride: 100 mmol/L (ref 98–111)
Creatinine, Ser: 0.92 mg/dL (ref 0.44–1.00)
GFR calc Af Amer: >60 mL/min (ref >60)
GFR calc non Af Amer: 59 mL/min — ABNORMAL LOW (ref >60)
Glucose, Bld: 115 mg/dL — ABNORMAL HIGH (ref 70–99)
Potassium: 4.6 mmol/L (ref 3.5–5.1)
Sodium: 137 mmol/L (ref 135–145)
Total Bilirubin: 0.5 mg/dL (ref 0.3–1.2)
Total Protein: 7.3 g/dL (ref 6.5–8.1)

## 2018-07-29 LAB — URINALYSIS, ROUTINE W REFLEX MICROSCOPIC
Bilirubin Urine: NEGATIVE
Glucose, UA: NEGATIVE mg/dL
Hgb urine dipstick: NEGATIVE
Ketones, ur: NEGATIVE mg/dL
Leukocytes,Ua: NEGATIVE
Nitrite: NEGATIVE
Protein, ur: NEGATIVE mg/dL
Specific Gravity, Urine: 1.003 — ABNORMAL LOW (ref 1.005–1.030)
pH: 7 (ref 5.0–8.0)

## 2018-07-29 LAB — TYPE AND SCREEN
ABO/RH(D): O POS
Antibody Screen: NEGATIVE

## 2018-07-29 LAB — LIPASE, BLOOD: Lipase: 34 U/L (ref 11–51)

## 2018-07-29 LAB — TROPONIN I: Troponin I: <0.03 ng/mL (ref <0.03)

## 2018-07-29 LAB — ABO/RH: ABO/RH(D): O POS

## 2018-07-29 MED ORDER — SODIUM CHLORIDE 0.9% FLUSH: 3.0000 mL | Freq: Once | INTRAVENOUS | Status: DC

## 2018-07-29 MED ORDER — SODIUM CHLORIDE 0.9 % IV BOLUS
1000.0000 mL | Freq: Once | INTRAVENOUS | Status: AC
  Administered 2018-07-29: 1000 mL via INTRAVENOUS

## 2018-07-29 MED ORDER — ONDANSETRON HCL 4 MG/2ML IJ SOLN
4.0000 mg | Freq: Once | INTRAMUSCULAR | Status: AC
  Administered 2018-07-29: 4 mg via INTRAVENOUS
  Filled 2018-07-29: qty 2

## 2018-07-29 MED ORDER — IOHEXOL 300 MG/ML  SOLN
100.0000 mL | Freq: Once | INTRAMUSCULAR | Status: AC | PRN
  Administered 2018-07-29: 100 mL via INTRAVENOUS

## 2018-07-29 NOTE — ED Provider Notes (Signed)
[facility]   CSN: [id number] Arrival date & time: 07/29/18  1024    History   Chief Complaint Chief Complaint  Patient presents with   Abnormal ECG   Rectal Bleeding   sent by doc   Abdominal Pain    HPI Heidi Preston is a 78 y.o. female.     78 y/o female with a PMH of HTN, Hyperlipidemia, DM presents to the ED sent by UC for generalized abdominal pain x 4 days. Patient reports sharp pain across the lower abdomen region worse with movement and palpation. She reports having multiple episodes of bloody stools while at home which have improved after taking imodium provided by her husband.She reports a similar episode in the past. Patient was seen at Urgent Care earlier and sent over for further evaluation. She denies any fever, emesis, chest pain or shortness of breath.   The history is provided by the patient and medical records. No language interpreter was used.    Past Medical History:  Diagnosis Date   Anxiety    Arthritis    Blepharitis, both eyes    Diabetes mellitus without complication (HCC)    Esophagitis, reflux    Fatigue    Hypercholesteremia    Hypertension    Hypothyroid    Neuromuscular disorder [facility](HCC)     Patient Active Problem List   Diagnosis Date Noted   Diarrhea of presumed infectious origin 04/10/2017   Dehydration 04/10/2017   Generalized abdominal pain 04/10/2017   General weakness 04/10/2017   History of depression 03/06/2017   History of hypothyroidism 03/06/2017   History of hyperglycemia 03/06/2017   Chest pain 05/04/2012   Urinary incontinence 05/04/2012   Pulmonary nodule 05/25/2011   Chronic cough 10/05/2009   KNEE PAIN, LEFT 05/19/2009   INSOMNIA 03/08/2009   HYPERTENSION, BENIGN ESSENTIAL 02/17/2009   PAIN IN JOINT, MULTIPLE SITES 02/17/2009   OSTEOARTHRITIS 11/23/2008   POSTHERPETIC NEURALGIA 09/04/2008   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      Home Medications    Prior to Admission medications   Medication Sig Start Date End Date Taking? Authorizing Provider  amLODipine (NORVASC) 5 MG tablet Take 5 mg by mouth daily.    [provider]  atorvastatin (LIPITOR) 80 MG tablet Take 80 mg by mouth daily.    [provider]  calcium carbonate (OSCAL) 1500 (600 Ca) MG TABS tablet Take by mouth 2 (two) times daily with a meal.    [provider]  cholestyramine (QUESTRAN) 4 g packet TAKE 1 PACKET (4 G TOTAL) BY MOUTH DAILY WITH LUNCH. 06/20/17   Danis, Starr LakeHenry L III, MD  donepezil (ARICEPT) 5 MG tablet Take 5 mg by mouth at bedtime.    [provider]  esomeprazole (NEXIUM) 40 MG capsule Take 40 mg by mouth daily before breakfast.    [provider]  fexofenadine (ALLEGRA) 180  MG tablet Take 180 mg by mouth daily.    [provider]  FLUoxetine (PROZAC) 20 MG capsule Take 20 mg by mouth daily.    [provider]  gabapentin (NEURONTIN) 300 MG capsule Take 300 mg by mouth daily.    [provider]  hydrOXYzine (ATARAX/VISTARIL) 25 MG tablet Take 1 tablet (25 mg total) by mouth every 8 (eight) hours as needed for itching. 10/10/17   McVey, Gelene Mink, PA-C  ibuprofen (ADVIL,MOTRIN) 800 MG tablet Take 1 tablet (800 mg total) by mouth every 8 (eight) hours as needed. 03/06/16   Lawyer, Harrell Gave,  PA-C  levothyroxine (SYNTHROID, LEVOTHROID) 50 MCG tablet  11/15/15   [provider]  loperamide (IMODIUM) 2 MG capsule Take 1 capsule (2 mg total) by mouth 4 (four) times daily as needed for diarrhea or loose stools. 04/10/17   Zigmund Gottron, NP  losartan-hydrochlorothiazide (HYZAAR) 100-12.5 MG tablet Take 1 tablet by mouth daily.    [provider]  metFORMIN (GLUCOPHAGE) 500 MG tablet Take 500 mg by mouth daily with breakfast.    [provider]  metoprolol tartrate (LOPRESSOR) 25 MG tablet Take 25 mg by mouth daily.    [provider]  mirtazapine (REMERON) 15 MG tablet Take 15 mg by mouth at bedtime.    [provider]  ondansetron (ZOFRAN-ODT) 4 MG disintegrating tablet Take 1 tablet (4 mg total) by mouth every 8 (eight) hours as needed for nausea or vomiting. 05/16/18   Davonna Belling, MD  sitaGLIPtin (JANUVIA) 50 MG tablet Take 50 mg by mouth daily.    [provider]  triamcinolone ointment (KENALOG) 0.5 % Apply 1 application topically 2 (two) times daily. 10/10/17   McVey, Gelene Mink, PA-C    Family History Family History  Problem Relation Age of Onset   Colon cancer Neg Hx    Stomach cancer Neg Hx    Esophageal cancer Neg Hx    Rectal cancer Neg Hx    Liver cancer Neg Hx     Social History Social History   Tobacco Use   Smoking status: Never Smoker   Smokeless tobacco: Never Used  Substance Use Topics   Alcohol use: No   Drug use: No     Allergies   Pravastatin   Review of Systems Review of Systems  Constitutional: Negative for chills and fever.  HENT: Negative for ear pain and sore throat.   Eyes: Negative for pain and visual disturbance.  Respiratory: Negative for cough and shortness of breath.   Cardiovascular: Negative for chest pain and palpitations.  Gastrointestinal: Positive for abdominal pain and nausea. Negative for vomiting.  Genitourinary: Negative for dysuria, hematuria,  vaginal bleeding and vaginal discharge.  Musculoskeletal: Negative for arthralgias and back pain.  Skin: Negative for color change and rash.  Neurological: Negative for seizures and syncope.  All other systems reviewed and are negative.    Physical Exam Updated Vital Signs BP (!) 175/69    Pulse (!) 54    Temp 98.2 F (36.8 C)    Resp (!) 23    Ht 5\' 5"  (1.651 m)    Wt 65 kg    SpO2 97%    BMI 23.85 kg/m   Physical Exam Vitals signs and nursing note reviewed.  Constitutional:      General: She is not in acute distress.    Appearance: She is well-developed.  HENT:     Head: Normocephalic and atraumatic.     Mouth/Throat:  Pharynx: No oropharyngeal exudate.  Eyes:     Pupils: Pupils are equal, round, and reactive to light.  Neck:     Musculoskeletal: Normal range of motion.  Cardiovascular:     Rate and Rhythm: Regular rhythm. Bradycardia present.     Heart sounds: Normal heart sounds.  Pulmonary:     Effort: Pulmonary effort is normal. No respiratory distress.     Breath sounds: Normal breath sounds.  Abdominal:     General: Bowel sounds are normal. There is no distension.     Palpations: Abdomen is soft.     Tenderness: There is generalized abdominal tenderness. There is no right CVA tenderness or left CVA tenderness. Negative signs include Murphy's sign and McBurney's sign.     Hernia: No hernia is present.  Genitourinary:    Rectum: Guaiac result negative. Tenderness and external hemorrhoid present. Normal anal tone.     Comments: No blood on my exam. Musculoskeletal:        General: No tenderness or deformity.     Right lower leg: No edema.     Left lower leg: No edema.  Skin:    General: Skin is warm and dry.  Neurological:     Mental Status: She is alert and oriented to person, place, and time.      ED Treatments / Results  Labs (all labs ordered are listed, but only abnormal results are displayed) Labs Reviewed  COMPREHENSIVE METABOLIC PANEL -  Abnormal; Notable for the following components:      Result Value   Glucose, Bld 115 (*)    GFR calc non Af Amer 59 (*)    All other components within normal limits  URINALYSIS, ROUTINE W REFLEX MICROSCOPIC - Abnormal; Notable for the following components:   Color, Urine STRAW (*)    Specific Gravity, Urine 1.003 (*)    All other components within normal limits  LIPASE, BLOOD  CBC  TROPONIN I  POC OCCULT BLOOD, ED  TYPE AND SCREEN  ABO/RH    EKG EKG Interpretation  Date/Time:  Monday July 29 2018 10:31:29 EDT Ventricular Rate:  44 PR Interval:  200 QRS Duration: 58 QT Interval:  526 QTC Calculation: 449 R Axis:   -12 Text Interpretation:  Marked sinus bradycardia Moderate voltage criteria for LVH, may be normal variant ST & T wave abnormality, consider lateral ischemia Abnormal ECG similar to earlier in the day,  ST/T changes similar to 2018 Confirmed by Pricilla LovelessGoldston, Scott [phone]([id number]) on 07/29/2018 10:41:03 AM   Radiology Ct Abdomen Pelvis W Contrast  Result Date: 07/29/2018 CLINICAL DATA:  Diffuse abdominal pain and nausea for the past 3 days. Rectal bleeding. EXAM: CT ABDOMEN AND PELVIS WITH CONTRAST TECHNIQUE: Multidetector CT imaging of the abdomen and pelvis was performed using the standard protocol following bolus administration of intravenous contrast. CONTRAST:  100mL OMNIPAQUE IOHEXOL 300 MG/ML  SOLN COMPARISON:  CT abdomen pelvis dated March 06, 2016. FINDINGS: Lower chest: No acute abnormality. Hepatobiliary: Unchanged hepatic steatosis. No focal liver abnormality. Status post cholecystectomy. Mildly prominent common bile duct is likely due to post cholecystectomy state. Pancreas: Unremarkable. No pancreatic ductal dilatation or surrounding inflammatory changes. Spleen: Normal in size without focal abnormality. Adrenals/Urinary Tract: Adrenal glands are unremarkable. Kidneys are normal, without renal calculi, focal lesion, or hydronephrosis. Bladder is unremarkable. Stomach/Bowel:  Stomach is within normal limits. Appendix appears normal. No evidence of bowel wall thickening, distention, or inflammatory changes. Mild left-sided colonic diverticulosis. Vascular/Lymphatic: Aortic atherosclerosis. No enlarged abdominal or pelvic lymph  nodes. Reproductive: Status post hysterectomy. No adnexal masses. Other: No abdominal wall hernia or abnormality. No abdominopelvic ascites. No pneumoperitoneum. Musculoskeletal: No acute or significant osseous findings. IMPRESSION: 1.  No acute intra-abdominal process. 2. Unchanged hepatic steatosis. 3.  Aortic atherosclerosis (ICD10-I70.0). Electronically Signed   By: Obie DredgeWilliam T Derry M.D.   On: 07/29/2018 14:13    Procedures Procedures (including critical care time)  Medications Ordered in ED Medications  sodium chloride flush (NS) 0.9 % injection 3 mL (3 mLs Intravenous Not Given 07/29/18 1230)  sodium chloride 0.9 % bolus 1,000 mL (0 mLs Intravenous Stopped 07/29/18 1310)  ondansetron (ZOFRAN) injection 4 mg (4 mg Intravenous Given 07/29/18 1231)  iohexol (OMNIPAQUE) 300 MG/ML solution 100 mL (100 mLs Intravenous Contrast Given 07/29/18 1350)     Initial Impression / Assessment and Plan / ED Course  I have reviewed the triage vital signs and the nursing notes.  Pertinent labs & imaging results that were available during my care of the patient were reviewed by me and considered in my medical decision making (see chart for details).    With a past medical history of hypertension, hyperlipidemia, diabetes presents to the ED with complaints of lower abdominal pain along with dizziness, reports for the past 2 to 3 weeks she has had discomfort to her stomach, she has had some bloody stools with bright red blood, she was given Imodium by her husband which help with the diarrhea.  During arrival patient's heart rate is in the 40s, has continued to be in the upper 50s while in the ED, she denies any chest pain, shortness of breath at this time.  Patient is  currently followed by Dr. Ivar Drapeabezas her PCP, she reports trying to make an appointment with him today but unable to do so due to staffing.  During arrival patient complaining of generalized abdominal pain, mostly tender on the upper quadrant, EKG obtained along with laboratory results and CT imaging.  CBC showed no leukocytosis, hemoglobin is unremarkable.  Lipase is normal.  Troponins are negative.  CMP showed no electrolyte abnormality, creatinine level is within normal limits, LFTs are unremarkable.  Type and screen obtained.  Hemoccult obtained by me, no blood present during my exam.  UA showed no nitrites, leukocytes, white blood cell count. CT Abdomen showed: 1. No acute intra-abdominal process.  2. Unchanged hepatic steatosis.  3. Aortic atherosclerosis (ICD10-I70.0).     Patient was given 1 L of saline, Zofran for her symptoms which has helped with resolution of her symptoms.  Upon recheck patient does report she is noted her heart rate to be lower, HR was in the 40's during arrival, this remained in the 50s in her stay, denies any chest pain or shortness of breath.  She will follow-up with Dr. Ivar Drapeabezas in 1 week. Return precautions discussed at length. I have discussed this patient with Dr. Rosalia Hammersay who has evaluated patient and agrees with disposition.    Portions of this note were generated with Scientist, clinical (histocompatibility and immunogenetics)Dragon dictation software. Dictation errors may occur despite best attempts at proofreading.     Final Clinical Impressions(s) / ED Diagnoses   Final diagnoses:  Generalized abdominal pain    ED Discharge Orders    None       Claude MangesSoto, Edwyn Inclan, PA-C 07/29/18 1506    Margarita Grizzleay, Danielle, MD 07/29/18 [phone]

## 2018-07-29 NOTE — ED Notes (Signed)
Paulino Door granddaughter [phone]

## 2018-07-29 NOTE — ED Triage Notes (Signed)
Pt presents with ongoing nausea, diarrhea, abdominal pain, and dizziness for over a week.

## 2018-07-29 NOTE — Discharge Instructions (Signed)
Please go to emergency room for further evaluation.

## 2018-07-29 NOTE — ED Notes (Signed)
Pt taking po fluids and tolerating well. 

## 2018-07-29 NOTE — Discharge Instructions (Signed)
Su tomografia del estomago estuvo normal hoy. Por favor haga una cita con su doctor primario en una semana para evaluar sus simptomas. Si siente dolor de pecho, falta de aire, or fiebre porfavor regrese a la sala de [employer] [facility].

## 2018-07-29 NOTE — ED Notes (Signed)
Pt son Felicita Gage [phone]

## 2018-07-29 NOTE — ED Notes (Signed)
Patient transported to CT 

## 2018-07-29 NOTE — ED Notes (Addendum)
Pt arrives WC from triage. Discussed with pt through interpretor. Pt c/o abdominal pain and burning. Pt states unable to eat x 3 days. C/o nausea with food. States feels dizzy and weak. Pt states noted blood at rectum.

## 2018-07-29 NOTE — ED Provider Notes (Signed)
[facility]    CSN: [id number] Arrival date & time: 07/29/18  0900     History   Chief Complaint Chief Complaint  Patient presents with  . Abdominal Pain  . Diarrhea  . Dizziness  . Nausea    HPI Spanish interpretation via [employer] interpreter. Heidi Preston is a 78 y.o. female history of hypertension, hyperlipidemia, DM type II, presenting today for evaluation of abdominal pain and dizziness.  Patient states that over the past 2 to 3 weeks she has had discomfort in her abdomen.  Over the past 3 days this is progressed to significant nausea and associated diarrhea.  She has had bloody stools.  Bloody stools have been bright red.  States that she feels as if she has pressure coming up into her chest.  Denies any vomiting.  She has tried taking Imodium as well as nauzene OTC.  She notes that she has progressively felt more and more weak.  Feels very faint.  HPI  Past Medical History:  Diagnosis Date  . Anxiety   . Arthritis   . Blepharitis, both eyes   . Diabetes mellitus without complication (HCC)   . Esophagitis, reflux   . Fatigue   . Hypercholesteremia   . Hypertension   . Hypothyroid   . Neuromuscular disorder [facility](HCC)     Patient Active Problem List   Diagnosis Date Noted  . Diarrhea of presumed infectious origin 04/10/2017  . Dehydration 04/10/2017  . Generalized abdominal pain 04/10/2017  . General weakness 04/10/2017  . History of depression 03/06/2017  . History of hypothyroidism 03/06/2017  . History of hyperglycemia 03/06/2017  . Chest pain 05/04/2012  . Urinary incontinence 05/04/2012  . Pulmonary nodule 05/25/2011  . Chronic cough 10/05/2009  . KNEE PAIN, LEFT 05/19/2009  . INSOMNIA 03/08/2009  . HYPERTENSION, BENIGN ESSENTIAL 02/17/2009  . PAIN IN JOINT, MULTIPLE SITES 02/17/2009  . OSTEOARTHRITIS 11/23/2008  . POSTHERPETIC NEURALGIA 09/04/2008  . 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 UTERINE PROLAPSE 01/01/2008  . Hypothyroidism 09/01/2006  .  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 OBESITY NOS 09/01/2006  . DEPRESSION 09/01/2006  . GASTROESOPHAGEAL REFLUX DISEASE 09/01/2006  . VAGINITIS, ATROPHIC 09/01/2006  . 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 Headache(784.0) 09/01/2006  . HYPERGLYCEMIA 09/01/2006  . ABNORMAL RESULT, FUNCTION STUDY, LIVER 09/01/2006    Past Surgical History:  Procedure Laterality Date  . CHOLECYSTECTOMY     45 years ago  . SHOULDER SURGERY Right   . TOTAL ABDOMINAL HYSTERECTOMY  2012    OB History   No obstetric history on file.      Home Medications    Prior to Admission medications   Medication Sig Start Date End Date Taking? Authorizing Provider  amLODipine (NORVASC) 5 MG tablet Take 5 mg by mouth daily.   Yes [provider]  atorvastatin (LIPITOR) 80 MG tablet Take 80 mg by mouth daily.   Yes [provider]  calcium carbonate (OSCAL) 1500 (600 Ca) MG TABS tablet Take by mouth 2 (two) times daily with a meal.   Yes [provider]  donepezil (ARICEPT) 5 MG tablet Take 5 mg by mouth at bedtime.   Yes [provider]  fexofenadine (ALLEGRA) 180 MG tablet Take 180 mg by mouth daily.   Yes [provider]  sitaGLIPtin (JANUVIA) 50 MG tablet Take 50 mg by mouth daily.   Yes [provider]  cholestyramine (QUESTRAN) 4 g packet TAKE 1 PACKET (4 G TOTAL) BY MOUTH DAILY WITH LUNCH. 06/20/17  Doran Stabler, MD  esomeprazole (NEXIUM) 40 MG capsule Take 40 mg by mouth daily before breakfast.    [provider]  FLUoxetine (PROZAC) 20 MG capsule Take 20 mg by mouth daily.    [provider]  gabapentin (NEURONTIN) 300 MG capsule Take 300 mg by mouth daily.    [provider]  hydrOXYzine (ATARAX/VISTARIL) 25 MG tablet Take 1 tablet (25 mg total) by mouth every 8 (eight) hours as needed for itching. 10/10/17   McVey, Gelene Mink, PA-C  ibuprofen (ADVIL,MOTRIN) 800 MG tablet Take 1 tablet (800 mg total) by mouth every 8  (eight) hours as needed. 03/06/16   Lawyer, Harrell Gave, PA-C  levothyroxine (SYNTHROID, LEVOTHROID) 50 MCG tablet  11/15/15   [provider]  loperamide (IMODIUM) 2 MG capsule Take 1 capsule (2 mg total) by mouth 4 (four) times daily as needed for diarrhea or loose stools. 04/10/17   Zigmund Gottron, NP  losartan-hydrochlorothiazide (HYZAAR) 100-12.5 MG tablet Take 1 tablet by mouth daily.    [provider]  metFORMIN (GLUCOPHAGE) 500 MG tablet Take 500 mg by mouth daily with breakfast.    [provider]  metoprolol tartrate (LOPRESSOR) 25 MG tablet Take 25 mg by mouth daily.    [provider]  mirtazapine (REMERON) 15 MG tablet Take 15 mg by mouth at bedtime.    [provider]  ondansetron (ZOFRAN-ODT) 4 MG disintegrating tablet Take 1 tablet (4 mg total) by mouth every 8 (eight) hours as needed for nausea or vomiting. 05/16/18   Davonna Belling, MD  triamcinolone ointment (KENALOG) 0.5 % Apply 1 application topically 2 (two) times daily. 10/10/17   McVey, Gelene Mink, PA-C    Family History Family History  Problem Relation Age of Onset  . Colon cancer Neg Hx   . Stomach cancer Neg Hx   . Esophageal cancer Neg Hx   . Rectal cancer Neg Hx   . Liver cancer Neg Hx     Social History Social History   Tobacco Use  . Smoking status: Never Smoker  . Smokeless tobacco: Never Used  Substance Use Topics  . Alcohol use: No  . Drug use: No     Allergies   Pravastatin   Review of Systems Review of Systems  Constitutional: Positive for fatigue. Negative for activity change, appetite change, chills and fever.  HENT: Negative for congestion, ear pain, rhinorrhea, sinus pressure, sore throat and trouble swallowing.   Eyes: Negative for discharge and redness.  Respiratory: Positive for cough. Negative for chest tightness and shortness of breath.   Cardiovascular: Negative for chest pain.  Gastrointestinal: Positive for abdominal pain.  Negative for diarrhea, nausea and vomiting.  Musculoskeletal: Negative for myalgias.  Skin: Negative for rash.  Neurological: Positive for dizziness, weakness and light-headedness. Negative for headaches.     Physical Exam Triage Vital Signs ED Triage Vitals  Enc Vitals Group     BP 07/29/18 0924 (!) 145/71     Pulse Rate 07/29/18 0924 (!) 50     Resp 07/29/18 0924 16     Temp 07/29/18 0924 97.8 F (36.6 C)     Temp Source 07/29/18 0924 Oral     SpO2 07/29/18 0924 97 %     Weight --      Height --      Head Circumference --      Peak Flow --      Pain Score 07/29/18 0931 6     Pain Loc --  Pain Edu? --      Excl. in GC? --    No data found.  Updated Vital Signs BP (!) 145/71 (BP Location: Right Arm)   Pulse (!) 50   Temp 97.8 F (36.6 C) (Oral)   Resp 16   SpO2 97%   Visual Acuity Right Eye Distance:   Left Eye Distance:   Bilateral Distance:    Right Eye Near:   Left Eye Near:    Bilateral Near:     Physical Exam Vitals signs and nursing note reviewed.  Constitutional:      General: She is not in acute distress.    Appearance: She is well-developed.     Comments: Sitting in wheelchair  HENT:     Head: Normocephalic and atraumatic.  Eyes:     Conjunctiva/sclera: Conjunctivae normal.  Neck:     Musculoskeletal: Neck supple.  Cardiovascular:     Rate and Rhythm: Normal rate and regular rhythm.     Heart sounds: No murmur.  Pulmonary:     Effort: Pulmonary effort is normal. No respiratory distress.     Breath sounds: Normal breath sounds.     Comments: Breathing comfortably at rest, CTABL, no wheezing, rales or other adventitious sounds auscultated Abdominal:     Palpations: Abdomen is soft.     Tenderness: There is abdominal tenderness.     Comments: Mild discomfort to palpation throughout abdomen, increased tenderness to left lower quadrant  Skin:    General: Skin is warm and dry.  Neurological:     Mental Status: She is alert.      UC  Treatments / Results  Labs (all labs ordered are listed, but only abnormal results are displayed) Labs Reviewed - No data to display  EKG None  Radiology No results found.  Procedures Procedures (including critical care time)  Medications Ordered in UC Medications - No data to display  Initial Impression / Assessment and Plan / UC Course  I have reviewed the triage vital signs and the nursing notes.  Pertinent labs & imaging results that were available during my care of the patient were reviewed by me and considered in my medical decision making (see chart for details).     EKG normal sinus rhythm with T wave inversion in leads I, 2, 3, aVF as well as lateral leads.  These were previously inverted on EKG in 2018, but do seem more prominent today.  Given patient's age, symptoms, recommending further evaluation in emergency room as likely needs blood work, fluids and rule out underlying GI bleed versus other abdominal etiology.  Stable on discharge, transported to ED in wheelchair with nursing staff.  Final Clinical Impressions(s) / UC Diagnoses   Final diagnoses:  Dizziness  Generalized abdominal pain     Discharge Instructions     Please go to emergency room for further evaluation    ED Prescriptions    None     Controlled Substance Prescriptions [place] Controlled Substance Registry consulted? Not Applicable   Lew DawesWieters,  C, [place] JerseyPA-C 07/29/18 1019

## 2018-07-29 NOTE — ED Notes (Signed)
DC instructiobns discussed at length through interpretor. DC instructions also discussed with daughter and granddaughter thropugh phone.

## 2018-07-29 NOTE — ED Triage Notes (Addendum)
Pt in from UC w/abdominal pain, bright red blood in stool yesterday, dizziness x 1 wk and abnormal EKG today. Brady in 33's in triage. Has not taken normal meds x 3 days

## 2018-07-29 NOTE — ED Notes (Signed)
Patient is being transferred from the [facility] [place] to the Emergency Department via wheelchair by staff. Patient is stable but in need of higher level of care due to abdominal pain. Patient is aware and verbalizes understanding of plan of care.  Vitals:   07/29/18 0924  BP: (!) 145/71  Pulse: (!) 50  Resp: 16  Temp: 97.8 F (36.6 C)  SpO2: 97%

## 2018-10-23 ENCOUNTER — Ambulatory Visit (INDEPENDENT_AMBULATORY_CARE_PROVIDER_SITE_OTHER): Payer: 59 | Admitting: Physician Assistant

## 2018-10-23 ENCOUNTER — Other Ambulatory Visit (INDEPENDENT_AMBULATORY_CARE_PROVIDER_SITE_OTHER): Payer: 59

## 2018-10-23 ENCOUNTER — Other Ambulatory Visit: Payer: Self-pay

## 2018-10-23 ENCOUNTER — Encounter: Payer: Self-pay | Admitting: Physician Assistant

## 2018-10-23 VITALS — BP 124/60 | HR 70 | Temp 98.6°F | Ht 65.0 in | Wt 167.4 lb

## 2018-10-23 DIAGNOSIS — R109 Unspecified abdominal pain: Secondary | ICD-10-CM

## 2018-10-23 DIAGNOSIS — R110 Nausea: Secondary | ICD-10-CM

## 2018-10-23 DIAGNOSIS — R197 Diarrhea, unspecified: Secondary | ICD-10-CM | POA: Diagnosis not present

## 2018-10-23 LAB — CBC WITH DIFFERENTIAL/PLATELET
Basophils Absolute: 0 10*3/uL (ref 0.0–0.1)
Basophils Relative: 0.4 % (ref 0.0–3.0)
Eosinophils Absolute: 0 10*3/uL (ref 0.0–0.7)
Eosinophils Relative: 0.3 % (ref 0.0–5.0)
HCT: 34.3 % — ABNORMAL LOW (ref 36.0–46.0)
Hemoglobin: 11.4 g/dL — ABNORMAL LOW (ref 12.0–15.0)
Lymphocytes Relative: 14.6 % (ref 12.0–46.0)
Lymphs Abs: 1.3 10*3/uL (ref 0.7–4.0)
MCHC: 33.3 g/dL (ref 30.0–36.0)
MCV: 94 fl (ref 78.0–100.0)
Monocytes Absolute: 0.8 10*3/uL (ref 0.1–1.0)
Monocytes Relative: 9.4 % (ref 3.0–12.0)
Neutro Abs: 6.6 10*3/uL (ref 1.4–7.7)
Neutrophils Relative %: 75.3 % (ref 43.0–77.0)
Platelets: 252 10*3/uL (ref 150.0–400.0)
RBC: 3.65 Mil/uL — ABNORMAL LOW (ref 3.87–5.11)
RDW: 13.7 % (ref 11.5–15.5)
WBC: 8.8 10*3/uL (ref 4.0–10.5)

## 2018-10-23 LAB — COMPREHENSIVE METABOLIC PANEL
ALT: 54 U/L — ABNORMAL HIGH (ref 0–35)
AST: 50 U/L — ABNORMAL HIGH (ref 0–37)
Albumin: 4 g/dL (ref 3.5–5.2)
Alkaline Phosphatase: 71 U/L (ref 39–117)
BUN: 20 mg/dL (ref 6–23)
CO2: 28 mEq/L (ref 19–32)
Calcium: 9.3 mg/dL (ref 8.4–10.5)
Chloride: 105 mEq/L (ref 96–112)
Creatinine, Ser: 0.9 mg/dL (ref 0.40–1.20)
GFR: 60.27 mL/min (ref >60.00)
Glucose, Bld: 129 mg/dL — ABNORMAL HIGH (ref 70–99)
Potassium: 4.1 mEq/L (ref 3.5–5.1)
Sodium: 140 mEq/L (ref 135–145)
Total Bilirubin: 0.4 mg/dL (ref 0.2–1.2)
Total Protein: 7.1 g/dL (ref 6.0–8.3)

## 2018-10-23 LAB — SEDIMENTATION RATE: Sed Rate: 45 mm/hr — ABNORMAL HIGH (ref 0–30)

## 2018-10-23 MED ORDER — ONDANSETRON HCL 4 MG PO TABS: 4.0000 mg | ORAL_TABLET | Freq: Four times a day (QID) | ORAL | 1 refills | Status: DC | PRN

## 2018-10-23 NOTE — Progress Notes (Signed)
Subjective:    Patient ID: Heidi Preston, female    DOB: 12-24-1940, 78 y.o.   MRN: [id number]  HPI Heidi Preston is a pleasant 78 year old Hispanic female, established with Dr. Loletha Carrow who comes in today with complaints of 1 month history of terrible diarrhea.  Patient is non-English speaking, is accompanied by her granddaughter who interprets.  She says she is having up to 10 bowel movements per day of loose to liquid stool, and this is been associated with abdominal bloating and gas.  She has not had any associated fever or chills, she has had nausea but no vomiting.  Appetite has been okay though she has been eating smaller amounts.  She did have 1 day of noting bright red blood mixed with the diarrhea last week. She says about 1 day/week over the past month she may go the entire day without having a bowel movement then goes right back to multiple loose stools per day. She is not on any over-the-counter antidiarrheals.  No family members have been ill, no recent antibiotics.  No new medications or dosage changes. Patient does have history of IBS, hypertension, GERD, osteoarthritis and hypothyroidism. Last colonoscopy March 2019 showed diverticulosis.  She had random biopsies done which were negative. Patient did have CT of the abdomen and pelvis done in June 2020 due to complaints of abdominal pain.  There was no acute process noted, she does have hepatic steatosis.  Review of Systems Pertinent positive and negative review of systems were noted in the above HPI section.  All other review of systems was otherwise negative.  Outpatient Encounter Medications as of 10/23/2018  Medication Sig  . amLODipine (NORVASC) 5 MG tablet Take 5 mg by mouth daily.  Marland Kitchen atorvastatin (LIPITOR) 80 MG tablet Take 80 mg by mouth daily.  . calcium carbonate (OSCAL) 1500 (600 Ca) MG TABS tablet Take by mouth 2 (two) times daily with a meal.  . donepezil (ARICEPT) 5 MG tablet Take 5 mg by mouth at bedtime.  Marland Kitchen esomeprazole  (NEXIUM) 40 MG capsule Take 40 mg by mouth daily before breakfast.  . FLUoxetine (PROZAC) 20 MG capsule Take 20 mg by mouth daily.  Marland Kitchen gabapentin (NEURONTIN) 300 MG capsule Take 300 mg by mouth daily.  . hydrOXYzine (ATARAX/VISTARIL) 25 MG tablet Take 1 tablet (25 mg total) by mouth every 8 (eight) hours as needed for itching.  . levothyroxine (SYNTHROID, LEVOTHROID) 50 MCG tablet   . losartan-hydrochlorothiazide (HYZAAR) 100-12.5 MG tablet Take 1 tablet by mouth daily.  . metoprolol tartrate (LOPRESSOR) 25 MG tablet Take 25 mg by mouth daily.  . mirtazapine (REMERON) 15 MG tablet Take 15 mg by mouth at bedtime.  . sitaGLIPtin (JANUVIA) 50 MG tablet Take 50 mg by mouth daily.  . ondansetron (ZOFRAN) 4 MG tablet Take 1 tablet (4 mg total) by mouth every 6 (six) hours as needed for nausea or vomiting. For nausea  . [DISCONTINUED] cholestyramine (QUESTRAN) 4 g packet TAKE 1 PACKET (4 G TOTAL) BY MOUTH DAILY WITH LUNCH. (Patient not taking: Reported on 10/23/2018)  . [DISCONTINUED] fexofenadine (ALLEGRA) 180 MG tablet Take 180 mg by mouth daily.  . [DISCONTINUED] ibuprofen (ADVIL,MOTRIN) 800 MG tablet Take 1 tablet (800 mg total) by mouth every 8 (eight) hours as needed. (Patient not taking: Reported on 10/23/2018)  . [DISCONTINUED] loperamide (IMODIUM) 2 MG capsule Take 1 capsule (2 mg total) by mouth 4 (four) times daily as needed for diarrhea or loose stools. (Patient not taking: Reported on 10/23/2018)  . [  DISCONTINUED] metFORMIN (GLUCOPHAGE) 500 MG tablet Take 500 mg by mouth daily with breakfast.  . [DISCONTINUED] ondansetron (ZOFRAN-ODT) 4 MG disintegrating tablet Take 1 tablet (4 mg total) by mouth every 8 (eight) hours as needed for nausea or vomiting. (Patient not taking: Reported on 10/23/2018)  . [DISCONTINUED] triamcinolone ointment (KENALOG) 0.5 % Apply 1 application topically 2 (two) times daily. (Patient not taking: Reported on 10/23/2018)   Facility-Administered Encounter Medications as of  10/23/2018  Medication  . 0.9 %  sodium chloride infusion   Allergies  Allergen Reactions  . Pravastatin Other (See Comments)    Headache   Patient Active Problem List   Diagnosis Date Noted  . Diarrhea of presumed infectious origin 04/10/2017  . Dehydration 04/10/2017  . Generalized abdominal pain 04/10/2017  . General weakness 04/10/2017  . History of depression 03/06/2017  . History of hypothyroidism 03/06/2017  . History of hyperglycemia 03/06/2017  . Chest pain 05/04/2012  . Urinary incontinence 05/04/2012  . Pulmonary nodule 05/25/2011  . Chronic cough 10/05/2009  . KNEE PAIN, LEFT 05/19/2009  . INSOMNIA 03/08/2009  . HYPERTENSION, BENIGN ESSENTIAL 02/17/2009  . PAIN IN JOINT, MULTIPLE SITES 02/17/2009  . OSTEOARTHRITIS 11/23/2008  . POSTHERPETIC NEURALGIA 09/04/2008  . 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 UTERINE PROLAPSE 01/01/2008  . Hypothyroidism 09/01/2006  . 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 OBESITY NOS 09/01/2006  . DEPRESSION 09/01/2006  . GASTROESOPHAGEAL REFLUX DISEASE 09/01/2006  . VAGINITIS, ATROPHIC 09/01/2006  . 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 Headache(784.0) 09/01/2006  . HYPERGLYCEMIA 09/01/2006  . ABNORMAL RESULT, FUNCTION STUDY, LIVER 09/01/2006   Social History   Socioeconomic History  . Marital status: Married    Spouse name: Not on file  . Number of children: 4  . Years of education: Not on file  . Highest education level: Not on file  Occupational History  . Occupation: housewife  Social Needs  . Financial resource strain: Not on file  . Food insecurity    Worry: Not on file    Inability: Not on file  . Transportation needs    Medical: Not on file    Non-medical: Not on file  Tobacco Use  . Smoking status: Never Smoker  . Smokeless tobacco: Never Used  Substance and Sexual Activity  . Alcohol use: No  . Drug use: No  . Sexual activity: Never  Lifestyle  . Physical activity    Days per week: Not on file    Minutes per  session: Not on file  . Stress: Not on file  Relationships  . Social Musician on phone: Not on file    Gets together: Not on file    Attends religious service: Not on file    Active member of club or organization: Not on file    Attends meetings of clubs or organizations: Not on file    Relationship status: Not on file  . Intimate partner violence    Fear of current or ex partner: Not on file    Emotionally abused: Not on file    Physically abused: Not on file    Forced sexual activity: Not on file  Other Topics Concern  . Not on file  Social History Narrative  . Not on file    Ms. Sedler's family history is not on file.      Objective:    Vitals:   10/23/18 0915  BP: 124/60  Pulse: 70  Temp: 98.6 F (37 C)  SpO2: 98%  Physical Exam; Well-developed well-nourished elderly Hispanic female in no acute distress.  Accompanied by her granddaughter who does interpreting  Weight, 167 BMI 27.8  HEENT; nontraumatic normocephalic, EOMI, PER R LA, sclera anicteric. Oropharynx; not examined/wearing mask/COVID Neck; supple, no JVD Cardiovascular; regular rate and rhythm with S1-S2, no murmur rub or gallop Pulmonary; Clear bilaterally Abdomen; soft, nontender, nondistended, no palpable mass or hepatosplenomegaly, bowel sounds are active Rectal; not done today Skin; benign exam, no jaundice rash or appreciable lesions Extremities; no clubbing cyanosis or edema skin warm and dry Neuro/Psych; alert and oriented x4, grossly nonfocal mood and affect appropriate       Assessment & Plan:  #601 78 year old Hispanic female with history of IBS and diverticulosis who comes in now with 1 month history of more severe diarrhea with up to 10 bowel movements per day, associated with bloating and gas, some nausea without vomiting, and reports one episode of diarrhea with bright red blood mixed last week.  Suspect superimposed infectious gastroenteritis or colitis.  Rule out C.  Difficile.  Patient had been treated empirically for SIBO in the past without any improvement in symptoms.  She had also been given Questran, in the past which she did not find helpful.  #2 GERD 3.  Hypertension 4.  Osteoarthritis 5.  Hypothyroidism  Plan; CBC with differential, sed rate, CMet,.  GI path panel, stool for C. difficile by PCR and stool for lactoferrin. Patient encouraged to push oral fluids, and maintain soft bland diet. Start Zofran 4 mg every 6 hours PRN for nausea Trial of Imodium complete OTC 1-2 p.o. every morning then every 6 hours as needed. Patient had also complained of some anal burning secondary to diarrhea and was advised to try RectiCare over-the-counter. Further plans pending labs and stool studies.    Alysha Doolan S Emilyn Ruble PA-C 10/23/2018   Cc: Georgina QuintSagardia, Miguel Jose, *

## 2018-10-23 NOTE — Patient Instructions (Signed)
Go to the basement for labs today  Take Imodium Complete 1-2 each morning up to three times daily  Use OTC Recticare apply for anal irritation as needed  We will send Zofran to your [place] se compone de alimentos que, en general, son blandos y no tienen [place] grasa, fibra ni condimentos adicionales. Para el cuerpo, es ms fcil digerir alimentos sin grasa, fibra o condimentos. Adems, es menos probable que estos causen [employer] boca, la garganta, el [place] y otras partes de aparato digestivo. A menudo, se conoce a la dieta suave como dieta BRAT ([place], [place], Applesauce, Rice, arroz, pur de [place] y pan tostado]). En qu consiste el plan? Su mdico o especialista en nutricin y alimentos (nutricionista) pueden recomendar cambios especficos en su dieta para evitar o tratar sus sntomas. Estos cambios pueden incluir lo siguiente:  Consumir pequeas cantidades de comida con frecuencia.  Cocinar los alimentos hasta que estn lo bastante blandos para masticarlos con facilidad.  Masticar bien la comida.  Beber lquidos lentamente.  No consumir alimentos muy picantes, cidos o grasosos.  No comer frutas ctricas, como naranjas y toronjas. Qu debo saber acerca de esta dieta?  Consuma diferentes alimentos de la lista de alimentos de la dieta [place].  No siga una dieta suave durante ms tiempo del necesario.  Pregntele a su mdico si debe tomar vitaminas o suplementos. Qu alimentos puedo comer? Cereales  Cereales calientes, como crema de trigo. Arroz. Panes, galletas o tortillas elaborados con harina blanca refinada. Verduras Verduras cocidas o enlatadas. Pur de papas o papas hervidas. Lambert Mody  Bananas. Pur de [employer]. Otros tipos de frutas cocidas o enlatadas peladas y sin semillas, por ejemplo, duraznos o peras en lata. Carnes y otras protenas  Huevos revueltos. Socorro de man cremosa u  otras mantequillas de frutos secos. Carnes [employer] cocidas, como ave o pescado. Tofu. [place] o caldos. Lcteos Productos lcteos sin grasa, como [place], queso cottage y Estate agent. [employer]. T de hierbas. Jugo de [place]. Grasas y aceites Aderezos suaves para ensaladas. Aceite de canola o de oliva. Dulces y postres Pudin. Natillas. Gelatina de frutas. Helados. Es posible que los productos mencionados arriba no formen una lista completa de las bebidas o los alimentos recomendados. Comunquese con un nutricionista para conocer ms opciones. Qu alimentos no se recomiendan? Cereales Panes y cereales integrales. Verduras Verduras crudas. Frutas Frutas crudas, especialmente los ctricos, frutos rojos o frutas secas. Lcteos Productos lcteos enteros. Bebidas Bebidas que contengan cafena. Alcohol. Alios y condimentos Aderezos o condimentos muy saborizados. Salsa picante. Salsa. Otros alimentos Comidas picantes. Comidas fritas. Alimentos cidos, como encurtidos o alimentos fermentados. Alimentos con alto contenido de Location manager. Alimentos ricos [employer]. Es posible que los productos que se enumeran ms arriba no sean una lista completa de los alimentos y las bebidas que se Higher education careers adviser. Comunquese con un nutricionista para obtener ms informacin. Resumen  Una dieta suave se compone de alimentos que, en general, son blandos y no tienen [place] grasa, fibra ni condimentos adicionales.  Para el cuerpo, es ms fcil digerir alimentos sin grasa, fibra o condimentos.  Consulte a su mdico para ver cunto tiempo debe seguir este plan de alimentacin. Esta dieta no est destinada a seguirse [employer]. Esta informacin no tiene Marine scientist el consejo del mdico. Asegrese de hacerle al mdico cualquier pregunta que tenga. Document Released: 05/31/2015 Document Revised: 04/12/2017 Document Reviewed: 04/12/2017 Elsevier Patient Education  [place].

## 2018-10-24 NOTE — Progress Notes (Signed)
____________________________________________________________  Attending physician addendum:  Thank you for sending this case to me. I have reviewed the entire note, and the outlined plan seems appropriate.  Underlying long-standing IBS, extensive prior workup. Perhaps acute infectious issue now.  Wilfrid Lund, MD  ____________________________________________________________

## 2018-10-25 ENCOUNTER — Other Ambulatory Visit: Payer: 59

## 2018-10-25 DIAGNOSIS — R197 Diarrhea, unspecified: Secondary | ICD-10-CM

## 2018-10-25 DIAGNOSIS — R109 Unspecified abdominal pain: Secondary | ICD-10-CM

## 2018-10-25 DIAGNOSIS — R110 Nausea: Secondary | ICD-10-CM

## 2018-10-27 LAB — CLOSTRIDIUM DIFFICILE EIA: C difficile Toxins A+B, EIA: NEGATIVE

## 2018-10-31 LAB — GASTROINTESTINAL PATHOGEN PANEL PCR
C. difficile Tox A/B, PCR: NOT DETECTED
Campylobacter, PCR: NOT DETECTED
Cryptosporidium, PCR: NOT DETECTED
E coli (ETEC) LT/ST PCR: NOT DETECTED
E coli (STEC) stx1/stx2, PCR: NOT DETECTED
E coli 0157, PCR: NOT DETECTED
Giardia lamblia, PCR: NOT DETECTED
Norovirus, PCR: NOT DETECTED
Rotavirus A, PCR: NOT DETECTED
Salmonella, PCR: NOT DETECTED
Shigella, PCR: NOT DETECTED

## 2018-10-31 LAB — FECAL LACTOFERRIN, QUANT
Fecal Lactoferrin: POSITIVE — AB
MICRO NUMBER:: 849398
SPECIMEN QUALITY:: ADEQUATE

## 2018-12-19 ENCOUNTER — Other Ambulatory Visit: Payer: Self-pay | Admitting: *Deleted

## 2018-12-19 ENCOUNTER — Other Ambulatory Visit (INDEPENDENT_AMBULATORY_CARE_PROVIDER_SITE_OTHER): Payer: 59

## 2018-12-19 DIAGNOSIS — R748 Abnormal levels of other serum enzymes: Secondary | ICD-10-CM

## 2018-12-19 DIAGNOSIS — D509 Iron deficiency anemia, unspecified: Secondary | ICD-10-CM

## 2018-12-19 LAB — CBC WITH DIFFERENTIAL/PLATELET
Basophils Absolute: 0 10*3/uL (ref 0.0–0.1)
Basophils Relative: 0.5 % (ref 0.0–3.0)
Eosinophils Absolute: 0.1 10*3/uL (ref 0.0–0.7)
Eosinophils Relative: 1.3 % (ref 0.0–5.0)
HCT: 33.6 % — ABNORMAL LOW (ref 36.0–46.0)
Hemoglobin: 11.3 g/dL — ABNORMAL LOW (ref 12.0–15.0)
Lymphocytes Relative: 24.4 % (ref 12.0–46.0)
Lymphs Abs: 2.1 10*3/uL (ref 0.7–4.0)
MCHC: 33.5 g/dL (ref 30.0–36.0)
MCV: 93 fl (ref 78.0–100.0)
Monocytes Absolute: 0.9 10*3/uL (ref 0.1–1.0)
Monocytes Relative: 10 % (ref 3.0–12.0)
Neutro Abs: 5.5 10*3/uL (ref 1.4–7.7)
Neutrophils Relative %: 63.8 % (ref 43.0–77.0)
Platelets: 238 10*3/uL (ref 150.0–400.0)
RBC: 3.61 Mil/uL — ABNORMAL LOW (ref 3.87–5.11)
RDW: 13.8 % (ref 11.5–15.5)
WBC: 8.6 10*3/uL (ref 4.0–10.5)

## 2018-12-19 LAB — HEPATIC FUNCTION PANEL
ALT: 17 U/L (ref 0–35)
AST: 18 U/L (ref 0–37)
Albumin: 4.1 g/dL (ref 3.5–5.2)
Alkaline Phosphatase: 52 U/L (ref 39–117)
Bilirubin, Direct: 0 mg/dL (ref 0.0–0.3)
Total Bilirubin: 0.2 mg/dL (ref 0.2–1.2)
Total Protein: 7.1 g/dL (ref 6.0–8.3)

## 2020-04-26 DIAGNOSIS — M5416 Radiculopathy, lumbar region: Secondary | ICD-10-CM | POA: Insufficient documentation

## 2020-08-12 ENCOUNTER — Encounter: Payer: Self-pay | Admitting: Gastroenterology

## 2020-08-12 ENCOUNTER — Other Ambulatory Visit: Payer: Self-pay

## 2020-08-12 ENCOUNTER — Ambulatory Visit (INDEPENDENT_AMBULATORY_CARE_PROVIDER_SITE_OTHER): Payer: Medicare HMO | Admitting: Gastroenterology

## 2020-08-12 VITALS — BP 160/80 | HR 64 | Ht 64.0 in | Wt 169.6 lb

## 2020-08-12 DIAGNOSIS — K582 Mixed irritable bowel syndrome: Secondary | ICD-10-CM | POA: Insufficient documentation

## 2020-08-12 DIAGNOSIS — R143 Flatulence: Secondary | ICD-10-CM | POA: Diagnosis not present

## 2020-08-12 DIAGNOSIS — R140 Abdominal distension (gaseous): Secondary | ICD-10-CM | POA: Diagnosis not present

## 2020-08-12 NOTE — Progress Notes (Signed)
08/12/2020 Heidi Preston [id number] [birth year]   HISTORY OF PRESENT ILLNESS: This is an 80 year old female is a patient of Dr. Irving Burton with history of IBS and alternating constipation and diarrhea.  She is here today with her husband for the same complaints.  They tell me that sometimes she is constipated and sometimes she has diarrhea.  She has a lot of gas, that they say is very foul smelling.  Recently she had a isolated episode where she had sat down and had a gush of blood come out from her rectum.  Resolved right away without recurrence.  Colonoscopy March 2019 by Dr. Myrtie Neither showed the following:  - The examined portion of the ileum was normal. - Normal mucosa in the entire examined colon. Biopsied. - Diverticulosis in the left colon. - The examination was otherwise normal on direct and retroflexion views.  Biopsies negative for microscopic colitis.  Entire visit was performed via interpreter/translator as patient is primarily Spanish-speaking.  Past Medical History:  Diagnosis Date   Anxiety    Arthritis    Blepharitis, both eyes    Diabetes mellitus without complication (HCC)    Esophagitis, reflux    Fatigue    Hypercholesteremia    Hypertension    Hypothyroid    Neuromuscular disorder (HCC)    Past Surgical History:  Procedure Laterality Date   CHOLECYSTECTOMY     45 years ago   COLONOSCOPY     SHOULDER SURGERY Right    TOTAL ABDOMINAL HYSTERECTOMY  2012    reports that she has never smoked. She has never used smokeless tobacco. She reports that she does not drink alcohol and does not use drugs. family history is not on file. Allergies  Allergen Reactions   Pravastatin Other (See Comments)    Headache      Outpatient Encounter Medications as of 08/12/2020  Medication Sig   aspirin 81 MG EC tablet Take 1 tablet by mouth in the morning.   atorvastatin (LIPITOR) 80 MG tablet Take 80 mg by mouth daily.   Cholecalciferol 50 MCG (2000 UT) CAPS Take 1  tablet by mouth daily.   donepezil (ARICEPT) 5 MG tablet Take 5 mg by mouth at bedtime.   esomeprazole (NEXIUM) 40 MG capsule Take 40 mg by mouth daily before breakfast.   gabapentin (NEURONTIN) 300 MG capsule Take 300 mg by mouth daily.   levothyroxine (SYNTHROID, LEVOTHROID) 50 MCG tablet    loperamide (IMODIUM) 2 MG capsule Take 1 capsule by mouth as needed.   losartan (COZAAR) 25 MG tablet Take 25 mg by mouth daily.   metoprolol tartrate (LOPRESSOR) 25 MG tablet Take 25 mg by mouth daily.   mirtazapine (REMERON) 15 MG tablet Take 15 mg by mouth at bedtime.   sertraline (ZOLOFT) 100 MG tablet Take 1 tablet by mouth daily.   sitaGLIPtin (JANUVIA) 50 MG tablet Take 50 mg by mouth daily.   tiZANidine (ZANAFLEX) 2 MG tablet Take by mouth.   [DISCONTINUED] amLODipine (NORVASC) 5 MG tablet Take 5 mg by mouth daily.   [DISCONTINUED] calcium carbonate (OSCAL) 1500 (600 Ca) MG TABS tablet Take by mouth 2 (two) times daily with a meal.   [DISCONTINUED] FLUoxetine (PROZAC) 20 MG capsule Take 20 mg by mouth daily.   [DISCONTINUED] hydrOXYzine (ATARAX/VISTARIL) 25 MG tablet Take 1 tablet (25 mg total) by mouth every 8 (eight) hours as needed for itching.   [DISCONTINUED] losartan-hydrochlorothiazide (HYZAAR) 100-12.5 MG tablet Take 1 tablet by mouth daily.   [DISCONTINUED] ondansetron (  ZOFRAN) 4 MG tablet Take 1 tablet (4 mg total) by mouth every 6 (six) hours as needed for nausea or vomiting. For nausea   Facility-Administered Encounter Medications as of 08/12/2020  Medication   0.9 %  sodium chloride infusion    REVIEW OF SYSTEMS  : All other systems reviewed and negative except where noted in the History of Present Illness.   PHYSICAL EXAM: BP (!) 160/80   Pulse 64   Ht 5\' 4"  (1.626 m)   Wt 169 lb 9.6 oz (76.9 kg)   BMI 29.11 kg/m  General: Well developed Hispanic female in no acute distress Head: Normocephalic and atraumatic Eyes:  Sclerae anicteric, conjunctiva pink. Ears: Normal  auditory acuity Lungs: Clear throughout to auscultation; no W/R/R. Heart: Regular rate and rhythm; no M/R/G. Abdomen: Soft, non-distended.  BS present.  Non-tender. Musculoskeletal: Symmetrical with no gross deformities  Skin: No lesions on visible extremities Extremities: No edema  Neurological: Alert oriented x 4, grossly non-focal Psychological:  Alert and cooperative. Normal mood and affect  ASSESSMENT AND PLAN: *81 year old Hispanic female with history of mixed IBS who is here today with complaints of alternating constipation and diarrhea as well as a lot of gas and bloating.  Discussed using Benefiber beginning with 2 teaspoons mixed in 8 ounces of liquid daily and increasing to twice daily if needed/tolerated.  Advised that she can use Gas-X regularly, 3 times a day before or after meals  if needed.  They will call back in 4 to 6 weeks with an update on her symptoms. *Rectal bleeding: Single isolated episode that resolved immediately without recurrence.  Likely hemorrhoidal or even possibly transient low-grade diverticular bleed.  Will monitor for now.  CC:  94., MD

## 2020-08-12 NOTE — Progress Notes (Signed)
____________________________________________________________  Attending physician addendum:  Thank you for sending this case to me. I have reviewed the entire note.  A previous trial of metronidazole and cephalexin as empiric therapy for SIBO was not helpful. However, given her ongoing symptoms, I think it would be worth a trial of rifaximin 550 mg twice daily x21 days if it can be obtained at a reasonable cost.  Amada Jupiter, MD  ____________________________________________________________

## 2020-08-12 NOTE — Patient Instructions (Signed)
  Comience con 2 cucharaditas de Benefiber en 8 onzas de lquido al da y puede aumentar a dos veces al da si lo [place].  Puede usar Gas-X tres veces al da segn sea necesario.   Vuelva a llamar en 4 a 6 semanas con una actualizacin, pregunte por Patty, RN.  If you are age 82 or older, your body mass index should be between 23-30. Your Body mass index is 29.11 kg/m. If this is out of the aforementioned range listed, please consider follow up with your Primary Care Provider.  If you are age 56 or younger, your body mass index should be between 19-25. Your Body mass index is 29.11 kg/m. If this is out of the aformentioned range listed, please consider follow up with your Primary Care Provider.   __________________________________________________________  The [place] [facility] providers would like to encourage you to use [facility] to communicate with providers for non-urgent requests or questions.  Due to long hold times on the telephone, sending your provider a message by [facility] may be a faster and more efficient way to get a response.  Please allow 48 business hours for a response.  Please remember that this is for non-urgent requests.

## 2020-08-25 IMAGING — CT CT HEAD WITHOUT CONTRAST
3 series · 15 of 47 positions shown, 18 images · non-contrast
Comparison: None.

CLINICAL DATA: Nausea, vomiting and diarrhea for 3 days. No fever
or abdominal pain.

EXAM:
CT HEAD WITHOUT CONTRAST
TECHNIQUE: Contiguous axial images were obtained from the base of the skull
through the vertex without intravenous contrast.

[Series 2: head wo · axial · 0.47mm/px · z∈[-165,-40]mm · 9 of 31 slices shown, 12 images]
[im 3/31  brain]
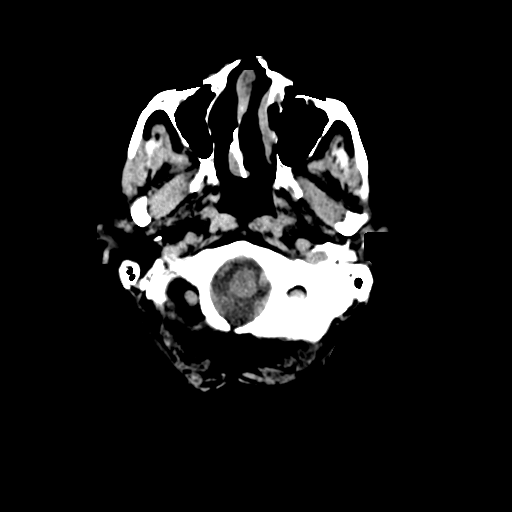
[im 3/31  bone]
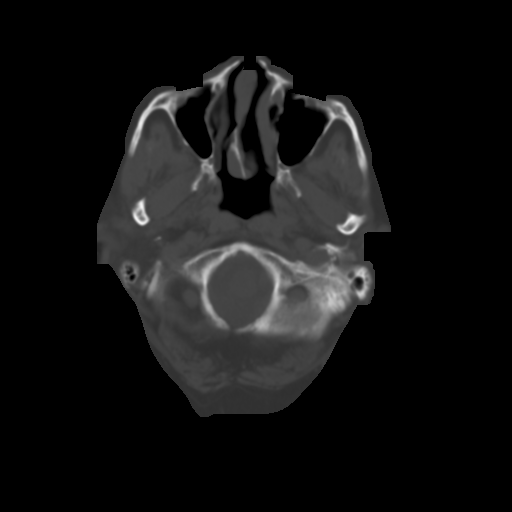
[im 6/31  brain]
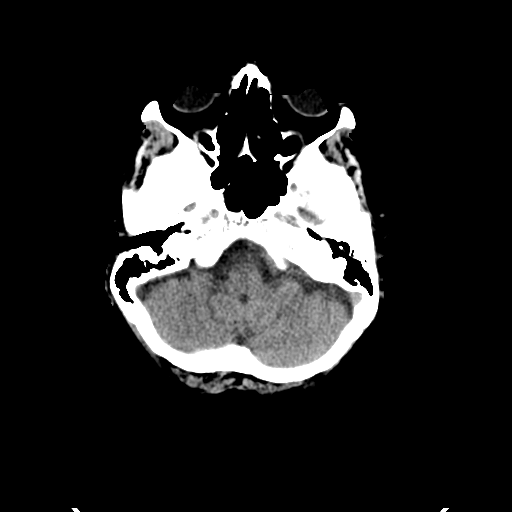
[im 9/31  brain]
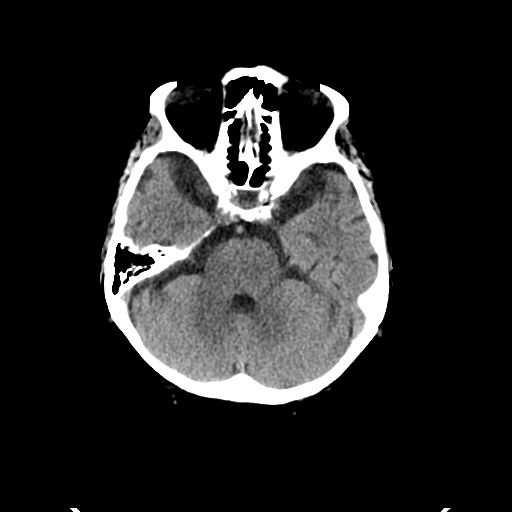
[im 12/31  brain]
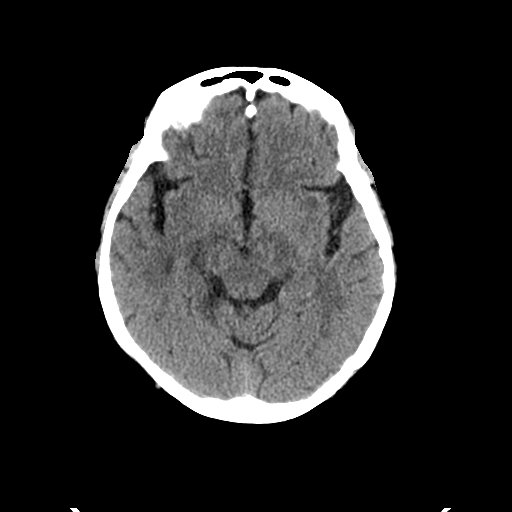
[im 16/31  brain]
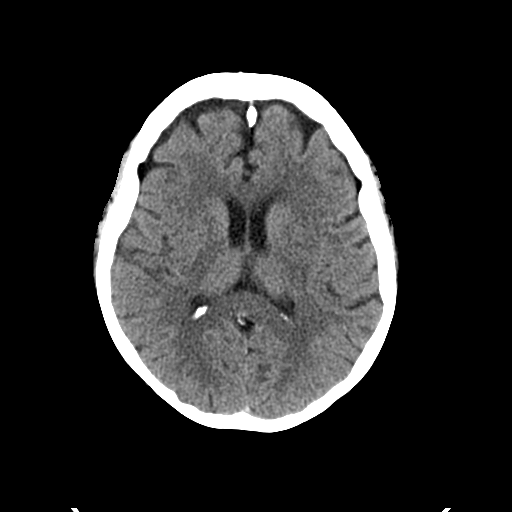
[im 16/31  bone]
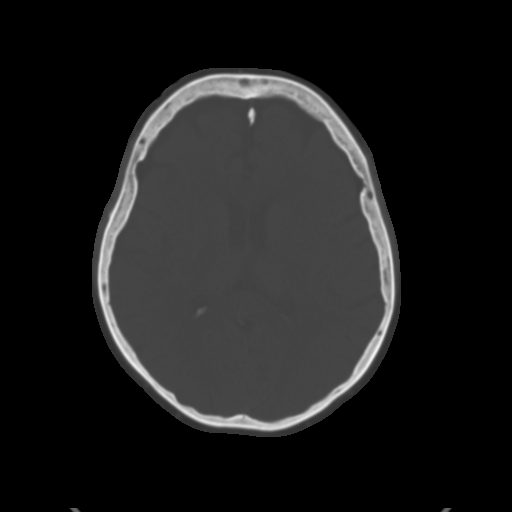
[im 19/31  brain]
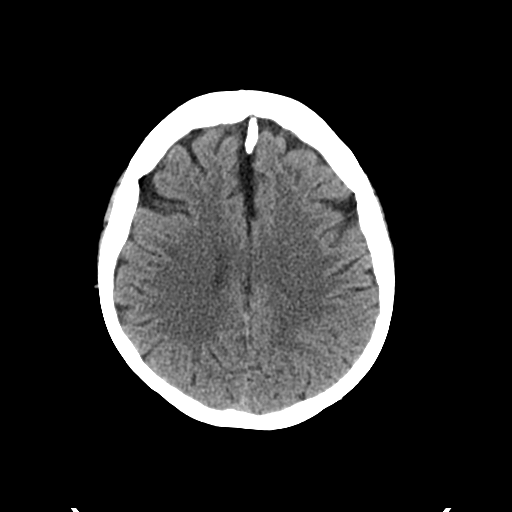
[im 22/31  brain]
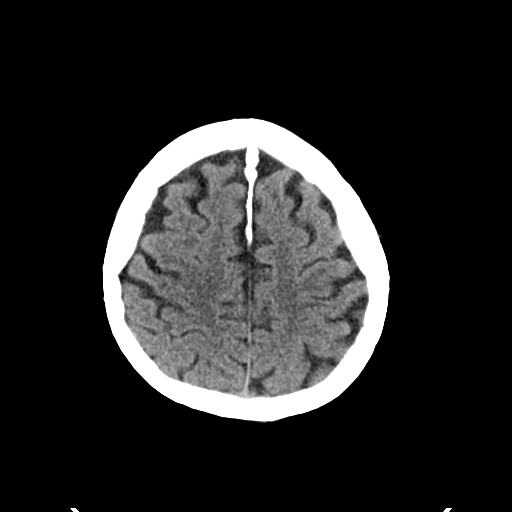
[im 25/31  brain]
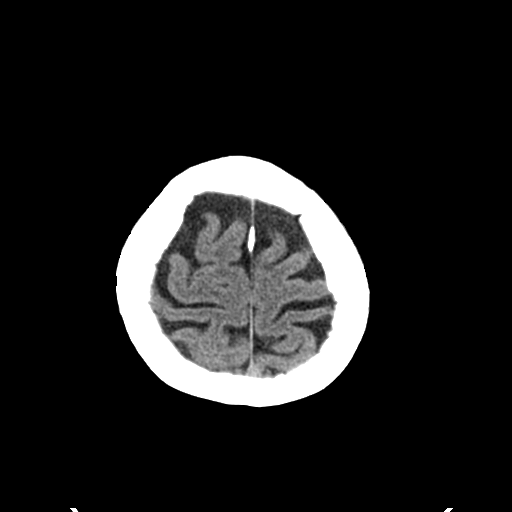
[im 28/31  brain]
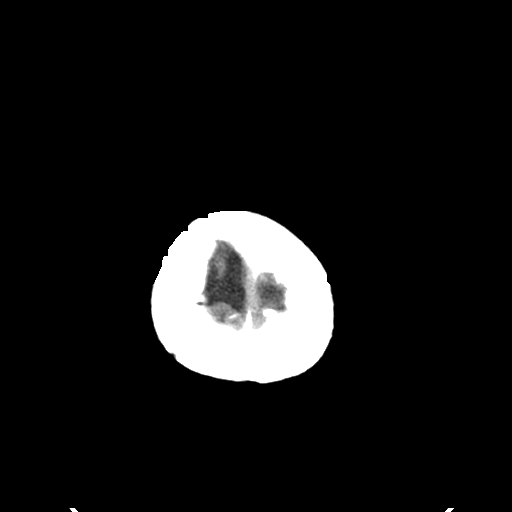
[im 28/31  bone]
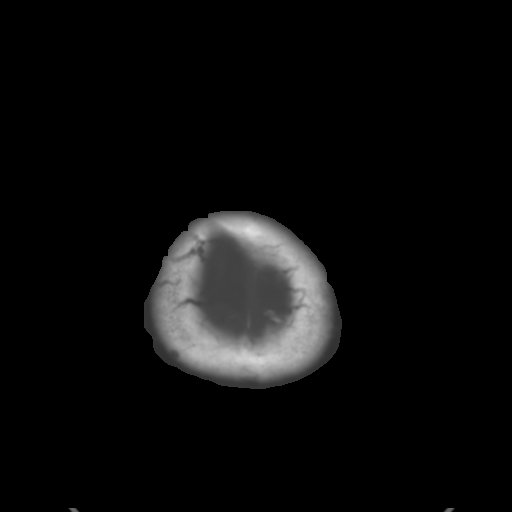

[Series 4: coronal soft tissue · coronal · 0.40mm/px · 3 of 61 slices shown]
[im 21/61  brain]
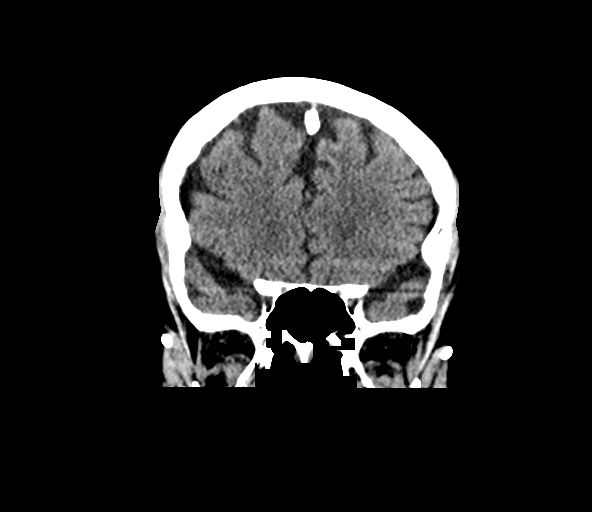
[im 27/61  brain]
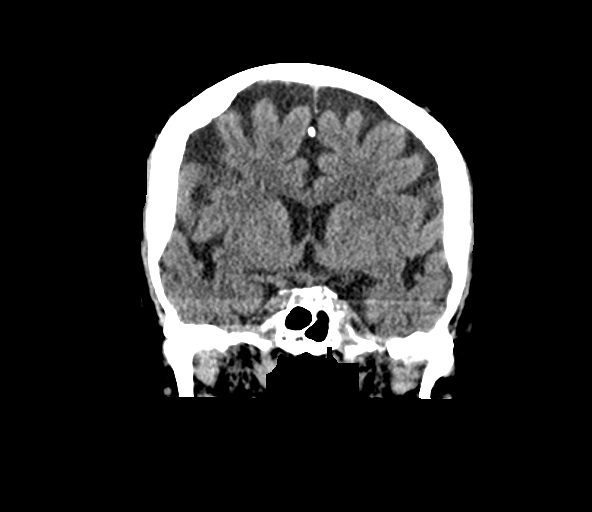
[im 34/61  brain]
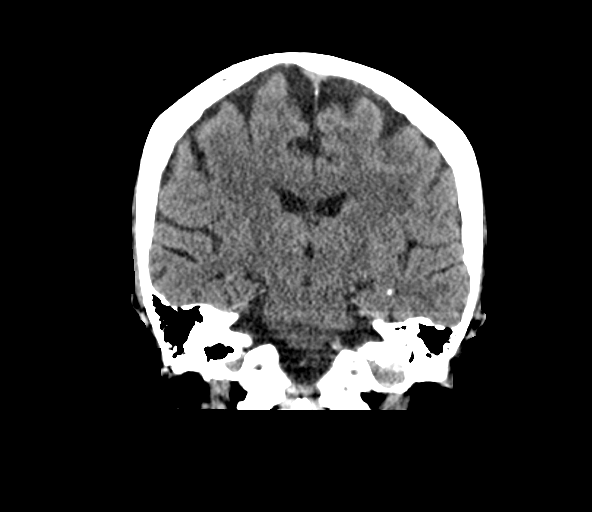

[Series 5: sagittal soft tissue · sagittal · 0.41mm/px · 3 of 54 slices shown]
[im 18/54  brain]
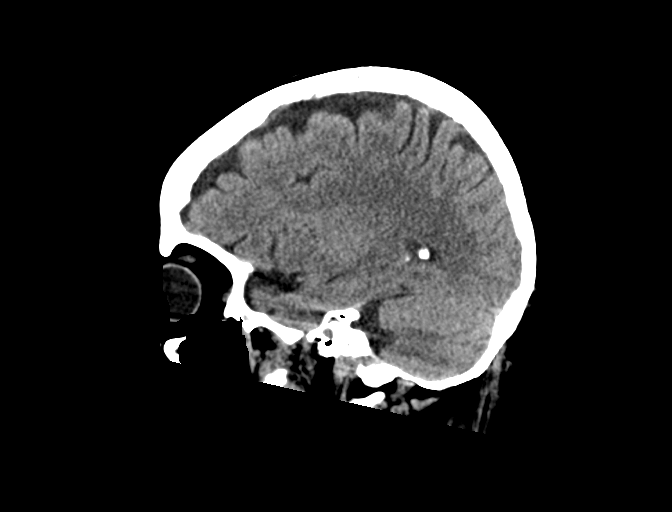
[im 27/54  brain]
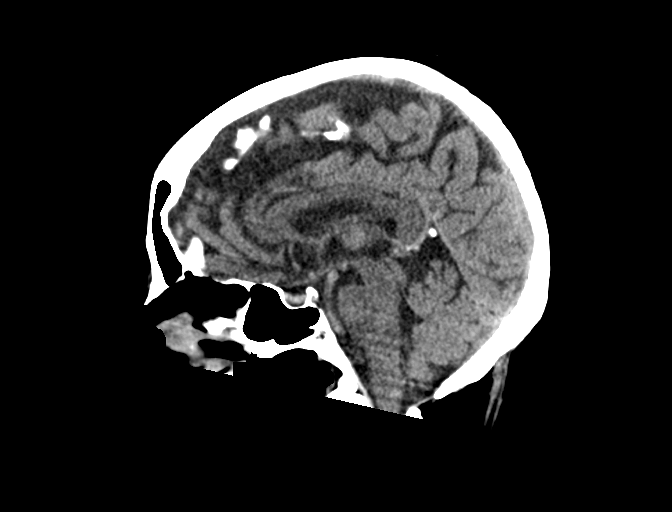
[im 36/54  brain]
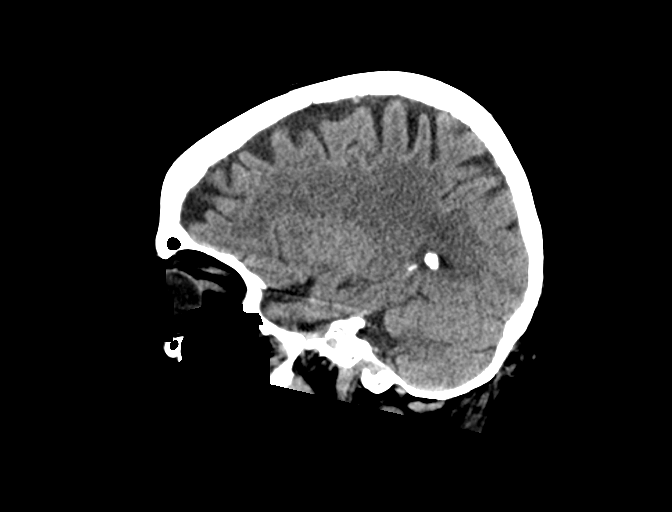

[15 of 47 positions shown; findings below may reference images not displayed]

FINDINGS: Brain: No evidence for acute infarction, hemorrhage, mass lesion,
hydrocephalus, or extra-axial fluid. Mild cerebral and cerebellar
atrophy. Hypoattenuation of white matter, likely small vessel
disease.

Vascular: Calcification of the cavernous internal carotid arteries
consistent with cerebrovascular atherosclerotic disease. No signs of
intracranial large vessel occlusion.

Skull: Normal. Negative for fracture or focal lesion.

Sinuses/Orbits: No acute finding.

Other: None.
IMPRESSION: Atrophy and small vessel disease.  No acute intracranial findings.

## 2021-11-04 ENCOUNTER — Encounter: Payer: Self-pay | Admitting: *Deleted

## 2021-11-04 ENCOUNTER — Ambulatory Visit (INDEPENDENT_AMBULATORY_CARE_PROVIDER_SITE_OTHER): Payer: Medicare HMO | Admitting: Nurse Practitioner

## 2021-11-04 VITALS — BP 130/60 | HR 66 | Ht 63.0 in | Wt 171.8 lb

## 2021-11-04 DIAGNOSIS — K625 Hemorrhage of anus and rectum: Secondary | ICD-10-CM

## 2021-11-04 NOTE — Patient Instructions (Addendum)
Apply a small amount of Desitin inside the anal opening and to the external anal area three times daily as needed for anal or hemorrhoidal irritation/bleeding.   Take Miralax 1 capful mixed in 8 ounces of water at bed time for constipation and to avoid straining as needed   Benefiber one tablespoon daily   Contact our office if your rectal bleeding persists or worsens   Follow up as needed   _______________________________________________________  If you are age 73 or older, your body mass index should be between 23-30. Your Body mass index is 30.43 kg/m. If this is out of the aforementioned range listed, please consider follow up with your Primary Care Provider.  If you are age 25 or younger, your body mass index should be between 19-25. Your Body mass index is 30.43 kg/m. If this is out of the aformentioned range listed, please consider follow up with your Primary Care Provider.   ________________________________________________________  The [place] [facility] providers would like to encourage you to use [facility] to communicate with providers for non-urgent requests or questions.  Due to long hold times on the telephone, sending your provider a message by [facility] may be a faster and more efficient way to get a response.  Please allow 48 business hours for a response.  Please remember that this is for non-urgent requests.  _______________________________________________________ Due to recent changes in healthcare laws, you may see the results of your imaging and laboratory studies on MyChart before your provider has had a chance to review them.  We understand that in some cases there may be results that are confusing or concerning to you. Not all laboratory results come back in the same time frame and the provider may be waiting for multiple results in order to interpret others.  Please give [place] 48 hours in order for your provider to thoroughly review all the results before contacting the office for  clarification of your results.

## 2021-11-04 NOTE — Progress Notes (Addendum)
11/04/2021 Heidi Preston [id number] [birth year]   Chief Complaint: Rectal bleeding   History of Present Illness: Heidi Preston. Heidi Preston is an 81 year old female with a past medical history of Alzheimer's disease, diabetes mellitus type 2, hypothyroidism, IBS, alternating constipation and diarrhea. She was last seen in office by Doug Sou PA-C 08/12/3020 after she had an episode of rectal bleeding which was likely due to hemorrhoids.  She presents today with recurrent rectal bleeding.  She speaks Spanish therefore she is accompanied by her granddaughter who interprets to facilitate effective communication throughout today's appointment.  She endorses passing a normal formed stool most days.  She denies straining.  She has infrequent diarrhea.  She describes seeing a small amount of bright red blood on the toilet tissue and in the toilet water x 2 episodes over the past month.  No associated anorectal pain.  Her appetite is good.  No weight loss.  Labs 09/26/2021 showed a hemoglobin level of 12.5.  hematocrit 38.2.  TSH 3.43.  BUN 18.  Creatinine 0.90.  Normal LFTs.  She underwent a colonoscopy by Dr. Myrtie Neither 05/01/2017 which identified diverticulosis in the left colon otherwise was normal, no evidence of microscopic colitis or IBD.  She takes aspirin 81 mg daily.  No other NSAID use.  No GERD symptoms on Esomeprazole 40 mg daily.    Past Medical History:  Diagnosis Date   Anxiety    Arthritis    Blepharitis, both eyes    Diabetes mellitus without complication (HCC)    Diverticulosis    Esophagitis, reflux    Fatigue    Hepatic steatosis    Hypercholesteremia    Hypertension    Hypothyroid    Neuromuscular disorder (HCC)    Past Surgical History:  Procedure Laterality Date   CHOLECYSTECTOMY     45 years ago   COLONOSCOPY     SHOULDER SURGERY Right    TOTAL ABDOMINAL HYSTERECTOMY  2012    Current Medications, Allergies, Past Medical History, Past Surgical History, Family History and  Social History were reviewed in [employer] record.   Review of Systems:   Constitutional: Negative for fever, sweats, chills or weight loss.  Respiratory: Negative for shortness of breath.   Cardiovascular: Negative for chest pain, palpitations and leg swelling.  Gastrointestinal: See HPI.  Musculoskeletal: Negative for back pain or muscle aches.  Neurological: Negative for dizziness, headaches or paresthesias.   Physical Exam: BP 130/60   Pulse 66   Ht 5\' 3"  (1.6 m)   Wt 171 lb 12.8 oz (77.9 kg)   BMI 30.43 kg/m   General: 81 year old female in no acute distress. Head: Normocephalic and atraumatic. Eyes: No scleral icterus. Conjunctiva pink . Ears: Normal auditory acuity. Mouth: Dentition intact. No ulcers or lesions.  Lungs: Clear throughout to auscultation. Heart: Regular rate and rhythm, no murmur. Abdomen: Soft, nontender and nondistended. No masses or hepatomegaly. Normal bowel sounds x 4 quadrants.  Rectal: Small non-inflamed external hemorrhoids. Anal hemorrhoids mildly inflamed without active bleeding.  No blood or mass. Dottie CMA present during exam. Musculoskeletal: Symmetrical with no gross deformities. Extremities: No edema. Neurological: Alert oriented x 4. No focal deficits.  Psychological: Alert and cooperative. Normal mood and affect  Assessment and Recommendations:  48) 81 year old female with rectal bleeding, likely from mildly inflamed anal hemorrhoids.  Colonoscopy 04/2017 showed diverticulosis otherwise was normal. -Apply a small amount of Desitin inside the anal opening and to the external anal area tid as needed  for anal or hemorrhoidal irritation/bleeding.  -Benefiber 1 tablespoon daily -MiraLAX nightly as needed for constipation, avoid straining, stop if diarrhea triggered -Follow-up as needed

## 2021-11-08 NOTE — Progress Notes (Signed)
____________________________________________________________  Attending physician addendum:  Thank you for sending this case to me. I have reviewed the entire note and agree with the plan.  If hemorrhoidal bleeding persists despite your recommendations and as-needed [place] of preparation H suppositories, I can evaluate her for hemorrhoidal banding.  Wilfrid Lund, MD  ____________________________________________________________

## 2022-03-28 DIAGNOSIS — M47816 Spondylosis without myelopathy or radiculopathy, lumbar region: Secondary | ICD-10-CM | POA: Insufficient documentation

## 2022-06-28 DIAGNOSIS — M533 Sacrococcygeal disorders, not elsewhere classified: Secondary | ICD-10-CM | POA: Insufficient documentation

## 2022-06-28 DIAGNOSIS — R112 Nausea with vomiting, unspecified: Secondary | ICD-10-CM | POA: Insufficient documentation

## 2022-06-28 DIAGNOSIS — R197 Diarrhea, unspecified: Secondary | ICD-10-CM | POA: Insufficient documentation

## 2022-09-14 ENCOUNTER — Ambulatory Visit: Payer: Medicare HMO | Admitting: Gastroenterology

## 2022-09-14 NOTE — Progress Notes (Deleted)
[facility] Consult Note:  History: Heidi Preston 09/14/2022  Referring provider: Andreas Preston., MD  Reason for consult/chief complaint: No chief complaint on file.   Subjective  HPI: Heidi Preston is an 82 year old woman first seen in this office November 2017 for transfer of care from Dr. Haywood Preston practice.  She had a multitude of issues including chronic cough with previous workup of possible GERD, abdominal pain, altered bowel habits bloating with intermittent internal hemorrhoidal bleeding.  Last colonoscopy with Heidi Preston March 2019, diverticulosis, hemorrhoids, negative for microscopic colitis or IBD.  Last office visit was with our APP in September 2023 for ongoing IBS symptoms with irregular bowel habits bloating and internal hemorrhoidal bleeding. PCP visit July 2024, normal TSH, creatinine 0.94, normal LFTs, hemoglobin A1c 7.4 ***   ROS:  Review of Systems   Past Medical History: Past Medical History:  Diagnosis Date   Anxiety    Arthritis    Blepharitis, both eyes    Diabetes mellitus without complication (HCC)    Diverticulosis    Esophagitis, reflux    Fatigue    Hepatic steatosis    Hypercholesteremia    Hypertension    Hypothyroid    Neuromuscular disorder (HCC)      Past Surgical History: Past Surgical History:  Procedure Laterality Date   CHOLECYSTECTOMY     45 years ago   COLONOSCOPY     SHOULDER SURGERY Right    TOTAL ABDOMINAL HYSTERECTOMY  2012     Family History: Family History  Problem Relation Age of Onset   Colon cancer Neg Hx    Stomach cancer Neg Hx    Esophageal cancer Neg Hx    Rectal cancer Neg Hx    Liver cancer Neg Hx     Social History: Social History   Socioeconomic History   Marital status: Married    Spouse name: Not on file   Number of children: 4   Years of education: Not on file   Highest education level: Not on file  Occupational History   Occupation: housewife  Tobacco Use   Smoking  status: Never   Smokeless tobacco: Never  Vaping Use   Vaping status: Never Used  Substance and Sexual Activity   Alcohol use: No   Drug use: No   Sexual activity: Never  Other Topics Concern   Not on file  Social History Narrative   Not on file   Social Determinants of Health   Financial Resource Strain: Not on file  Food Insecurity: Not on file  Transportation Needs: Not on file  Physical Activity: Not on file  Stress: Not on file  Social Connections: Unknown (07/05/2021)   Received from [facility]   Social Network    Social Network: Not on file    Allergies: Allergies  Allergen Reactions   Pravastatin Other (See Comments)    Headache    Outpatient Meds: Current Outpatient Medications  Medication Sig Dispense Refill   aspirin 81 MG EC tablet Take 1 tablet by mouth in the morning.     atorvastatin (LIPITOR) 80 MG tablet Take 80 mg by mouth daily.     Cholecalciferol 50 MCG (2000 UT) CAPS Take 1 tablet by mouth daily.     donepezil (ARICEPT) 5 MG tablet Take 5 mg by mouth at bedtime.     esomeprazole (NEXIUM) 40 MG capsule Take 40 mg by mouth daily before breakfast.     gabapentin (NEURONTIN) 300 MG capsule Take 300 mg  by mouth daily.     levothyroxine (SYNTHROID, LEVOTHROID) 50 MCG tablet      loperamide (IMODIUM) 2 MG capsule Take 1 capsule by mouth as needed.     losartan (COZAAR) 25 MG tablet Take 25 mg by mouth daily.     metoprolol tartrate (LOPRESSOR) 25 MG tablet Take 25 mg by mouth daily.     mirtazapine (REMERON) 15 MG tablet Take 15 mg by mouth at bedtime.     sertraline (ZOLOFT) 100 MG tablet Take 1 tablet by mouth daily.     sitaGLIPtin (JANUVIA) 50 MG tablet Take 50 mg by mouth daily.     tiZANidine (ZANAFLEX) 2 MG tablet Take by mouth.     No current facility-administered medications for this visit.      ___________________________________________________________________ Objective   Exam:  There were no vitals taken for this  visit. Wt Readings from Last 3 Encounters:  11/04/21 171 lb 12.8 oz (77.9 kg)  08/12/20 169 lb 9.6 oz (76.9 kg)  10/23/18 167 lb 6.4 oz (75.9 kg)    General: ***  Eyes: sclera anicteric, no redness ENT: oral mucosa moist without lesions, no cervical or supraclavicular lymphadenopathy CV: ***, no JVD, no peripheral edema Resp: clear to auscultation bilaterally, normal RR and effort noted GI: soft, *** tenderness, with active bowel sounds. No guarding or palpable organomegaly noted. Skin; warm and dry, no rash or jaundice noted Neuro: awake, alert and oriented x 3. Normal gross motor function and fluent speech  Labs:  ***  Radiologic Studies:  ***  Assessment: No diagnosis found.  ***  Plan:  ***  Thank you for the courtesy of this consult.  Please call me with any questions or concerns.  Heidi Preston III  CC: Referring provider noted above

## 2022-11-23 ENCOUNTER — Emergency Department (HOSPITAL_COMMUNITY)
Admission: EM | Admit: 2022-11-23 | Discharge: 2022-11-24 | Disposition: A | Discharge status: Home / Self Care | Payer: Medicare HMO | Attending: Emergency Medicine | Admitting: Emergency Medicine

## 2022-11-23 ENCOUNTER — Other Ambulatory Visit: Payer: Self-pay

## 2022-11-23 ENCOUNTER — Emergency Department (HOSPITAL_COMMUNITY): Payer: Medicare HMO

## 2022-11-23 ENCOUNTER — Encounter (HOSPITAL_COMMUNITY): Payer: Self-pay | Admitting: Emergency Medicine

## 2022-11-23 DIAGNOSIS — Z7982 Long term (current) use of aspirin: Secondary | ICD-10-CM | POA: Diagnosis not present

## 2022-11-23 DIAGNOSIS — S8001XA Contusion of right knee, initial encounter: Secondary | ICD-10-CM | POA: Diagnosis not present

## 2022-11-23 DIAGNOSIS — W1839XA Other fall on same level, initial encounter: Secondary | ICD-10-CM | POA: Diagnosis not present

## 2022-11-23 DIAGNOSIS — E039 Hypothyroidism, unspecified: Secondary | ICD-10-CM | POA: Insufficient documentation

## 2022-11-23 DIAGNOSIS — R296 Repeated falls: Secondary | ICD-10-CM | POA: Diagnosis not present

## 2022-11-23 DIAGNOSIS — E119 Type 2 diabetes mellitus without complications: Secondary | ICD-10-CM | POA: Insufficient documentation

## 2022-11-23 DIAGNOSIS — R531 Weakness: Secondary | ICD-10-CM | POA: Insufficient documentation

## 2022-11-23 DIAGNOSIS — S8991XA Unspecified injury of right lower leg, initial encounter: Secondary | ICD-10-CM | POA: Diagnosis present

## 2022-11-23 LAB — CBC
HCT: 39.7 % (ref 36.0–46.0)
Hemoglobin: 12.6 g/dL (ref 12.0–15.0)
MCH: 30.4 pg (ref 26.0–34.0)
MCHC: 31.7 g/dL (ref 30.0–36.0)
MCV: 95.9 fL (ref 80.0–100.0)
Platelets: 204 10*3/uL (ref 150–400)
RBC: 4.14 MIL/uL (ref 3.87–5.11)
RDW: 13.4 % (ref 11.5–15.5)
WBC: 13 10*3/uL — ABNORMAL HIGH (ref 4.0–10.5)
nRBC: 0 % (ref 0.0–0.2)

## 2022-11-23 LAB — BASIC METABOLIC PANEL
Anion gap: 13 (ref 5–15)
BUN: 28 mg/dL — ABNORMAL HIGH (ref 8–23)
CO2: 22 mmol/L (ref 22–32)
Calcium: 9.2 mg/dL (ref 8.9–10.3)
Chloride: 102 mmol/L (ref 98–111)
Creatinine, Ser: 1.2 mg/dL — ABNORMAL HIGH (ref 0.44–1.00)
GFR, Estimated: 45 mL/min — ABNORMAL LOW (ref >60)
Glucose, Bld: 249 mg/dL — ABNORMAL HIGH (ref 70–99)
Potassium: 3.8 mmol/L (ref 3.5–5.1)
Sodium: 137 mmol/L (ref 135–145)

## 2022-11-23 LAB — CBG MONITORING, ED: Glucose-Capillary: 215 mg/dL — ABNORMAL HIGH (ref 70–99)

## 2022-11-23 MED ORDER — SODIUM CHLORIDE 0.9 % IV BOLUS
1000.0000 mL | Freq: Once | INTRAVENOUS | Status: AC
  Administered 2022-11-23: 1000 mL via INTRAVENOUS

## 2022-11-23 NOTE — ED Triage Notes (Addendum)
Presents from home via EMS for sudden onset generalized weakness, diaphoresis, and dizziness at 6pm today. Fell while in restroom, unwitnessed, family found her on her knees when door opened to check on her. Bilateral knee redness, R knee abrasion. Denies pain but commonly does this (dementia).  Infusion for osteoporosis earlier today around 2pm, first time using this infusion With EMS, stroke screen negative. Positive orthostatic changes with EMS.  EMS VS: 118/70, HR98, CBG 277, 90% RA, improved to 92% on 2L.  In triage, spO2 is 95% on RA  H/o dementia  Not on a blood thinner

## 2022-11-23 NOTE — ED Provider Notes (Signed)
[place] EMERGENCY DEPARTMENT AT [facility] Provider Note   CSN: [id number] Arrival date & time: 11/23/22  2034     History  Chief Complaint  Patient presents with   Weakness    Heidi Preston is a 82 y.o. female.  The history is provided by the patient and medical records.  Weakness  82 y.o. F with hx of dehydration, depression HLP, hypothyroidism, OA, GERD, DM2, presenting to the ED for frequent falls.  Patient primarily provided by patient's daughter at bedside who helps to care for her.  Over the past several months patient has seemed to be declining, she is having frequent falls.  She was seen by Dr. At 1 point and felt like it was related to her medications so these were adjusted.  Today she had an infusion for osteoporosis around 2 PM.  Seemed to do fine with this infusion without any adverse reaction or other side effects.  She went home and around 6 PM she was in the bathroom.  She was taking longer than normal, husband knocked on the door to check on her and she said she was fine but about 15 minutes later family found her in the floor on her knees.  She did not strike her head or lose consciousness.  She was able to get up and ambulate afterwards.  Patient herself is denying any pain but she does have contusion to the right knee specifically.  She does have history of dementia and is not on anticoagulation.    Daughter reports they have already modified the house for her father who uses a walker (ie-- shower/potty chair, etc).  Patient herself was given a walker in the past few months, however does not seem very steady with this.  Daughter reports when trying to get up from couch/chair she seems like "dead weight" and does not seem to be able to push herself up anymore.  When walking she is not leaning to one side or the other, rather leaning forwards and causing herself to fall that way.  She did have orders for home PT but patient only completed about 2 weeks of  sessions before she refused to participate any further.  Home Medications Prior to Admission medications   Medication Sig Start Date End Date Taking? Authorizing Provider  aspirin 81 MG EC tablet Take 1 tablet by mouth in the morning.    [provider]  atorvastatin (LIPITOR) 80 MG tablet Take 80 mg by mouth daily.    [provider]  Cholecalciferol 50 MCG (2000 UT) CAPS Take 1 tablet by mouth daily. 03/05/19   [provider]  donepezil (ARICEPT) 5 MG tablet Take 5 mg by mouth at bedtime.    [provider]  esomeprazole (NEXIUM) 40 MG capsule Take 40 mg by mouth daily before breakfast.    [provider]  gabapentin (NEURONTIN) 300 MG capsule Take 300 mg by mouth daily.    [provider]  levothyroxine (SYNTHROID, LEVOTHROID) 50 MCG tablet  11/15/15   [provider]  loperamide (IMODIUM) 2 MG capsule Take 1 capsule by mouth as needed. 04/10/17   [provider]  losartan (COZAAR) 25 MG tablet Take 25 mg by mouth daily. 06/08/20   [provider]  metoprolol tartrate (LOPRESSOR) 25 MG tablet Take 25 mg by mouth daily.    [provider]  mirtazapine (REMERON) 15 MG tablet Take 15 mg by mouth at bedtime.    [provider]  sertraline (  ZOLOFT) 100 MG tablet Take 1 tablet by mouth daily. 05/16/20   [provider]  sitaGLIPtin (JANUVIA) 50 MG tablet Take 50 mg by mouth daily.    [provider]  tiZANidine (ZANAFLEX) 2 MG tablet Take by mouth. 02/12/19   [provider]      Allergies    Pravastatin    Review of Systems   Review of Systems  Neurological:  Positive for weakness.  All other systems reviewed and are negative.   Physical Exam Updated Vital Signs BP 139/62 (BP Location: Left Arm)   Pulse 86   Temp 98.3 F (36.8 C) (Oral)   Resp (!) 25   Ht 5\' 4"  (1.626 m)   Wt 74.8 kg   SpO2 97%   BMI 28.32 kg/m   Physical Exam Vitals and nursing note  reviewed.  Constitutional:      Appearance: She is well-developed.     Comments: Sitting on potty chair upon entering room, able to stand with 2 person assist and walk bad to ED stretcher  HENT:     Head: Normocephalic and atraumatic.     Comments: No visible head trauma Eyes:     Conjunctiva/sclera: Conjunctivae normal.     Pupils: Pupils are equal, round, and reactive to light.  Cardiovascular:     Rate and Rhythm: Normal rate and regular rhythm.     Heart sounds: Normal heart sounds.  Pulmonary:     Effort: Pulmonary effort is normal.     Breath sounds: Normal breath sounds.  Abdominal:     General: Bowel sounds are normal.     Palpations: Abdomen is soft.  Musculoskeletal:        General: Normal range of motion.     Cervical back: Normal range of motion.     Comments: Contusion to right knee, no significant swelling, no bony deformity, able to flex and extend  Skin:    General: Skin is warm and dry.  Neurological:     Mental Status: She is alert and oriented to person, place, and time.     ED Results / Procedures / Treatments   Labs (all labs ordered are listed, but only abnormal results are displayed) Labs Reviewed  BASIC METABOLIC PANEL - Abnormal; Notable for the following components:      Result Value   Glucose, Bld 249 (*)    BUN 28 (*)    Creatinine, Ser 1.20 (*)    GFR, Estimated 45 (*)    All other components within normal limits  CBC - Abnormal; Notable for the following components:   WBC 13.0 (*)    All other components within normal limits  CBG MONITORING, ED - Abnormal; Notable for the following components:   Glucose-Capillary 215 (*)    All other components within normal limits  URINALYSIS, ROUTINE W REFLEX MICROSCOPIC    EKG None  Radiology No results found.  Procedures Procedures    Medications Ordered in ED Medications  sodium chloride 0.9 % bolus 1,000 mL (has no administration in time range)    ED Course/ Medical Decision Making/  A&P                                 Medical Decision Making Amount and/or Complexity of Data Reviewed Labs: ordered. Radiology: ordered and independent interpretation performed. ECG/medicine tests: ordered and independent interpretation performed.   82 year old female presenting to the ED after frequent  falls.  She has been falling with increased frequency over the past several months, fell in the bathroom today and landed on her knees.  She did not have any head injury or loss of consciousness.  She is awake and alert here.  She does not have any focal neurologic deficits.  No visible signs of head trauma.  She does have contusion to the right knee without swelling or acute deformity.  Able to stand and ambulate at bedside with assistance.  Screening labs were obtained from triage, does have some signs of dehydration with mildly elevated serum creatinine and BUN.  Daughter reports she is not really wanting to eat/drink, supplementing with ensure.  She does have leukocytosis but no anemia.  Will add on UA, chest x-ray, knee films.  She is given IV fluid bolus.  1:47 AM UA without any signs of infection.  Chest x-ray with some vascular congestion but no pulmonary edema.  She is not hypoxic.  Knee films without acute findings.  Results discussed with daughter, she voiced understanding.  At this time, no findings to necessitate hospital admission.  I discussed with daughter mobility and safety issues at home as she has been having increased frequency of falls recently.  They do have devices and modification tools at home, however still continues falling.  She does not feel like she is at the point where she needs placement to a nursing facility, she is open to home health or reestablishing PT.  Patient primarily at home with elderly husband during the day (daughters both work) who also uses a walker so may be beneficial to have someone there with them during the day.  Order for face-to-face home health  evaluation placed.  As she has been having some difficulty eating, recommend Glucerna (daughter concerned about blood sugar while drinking ensure with hx of DM), encourage to continue trying PO food and hydration as much as possible.  Will also plan to neurologist regarding her Alzheimer's as daughter reports memory is worsening as well.  PCP follow-up in the interim.  Return here for new concerns.  Final Clinical Impression(s) / ED Diagnoses Final diagnoses:  Frequent falls  Generalized weakness    Rx / DC Orders ED Discharge Orders          Ordered    Home Health        11/24/22 0152    Face-to-face encounter (required for Medicare/Medicaid patients)       Comments: I Garlon Hatchet certify that this patient is under my care and that I, or a nurse practitioner or physician's assistant working with me, had a face-to-face encounter that meets the physician face-to-face encounter requirements with this patient on 11/24/2022. The encounter with the patient was in whole, or in part for the following medical condition(s) which is the primary reason for home health care (List medical condition): frequent falls   11/24/22 0152              Garlon Hatchet, PA-C 11/24/22 0157    Margarita Grizzle, MD 11/27/22 1241

## 2022-11-24 DIAGNOSIS — S8001XA Contusion of right knee, initial encounter: Secondary | ICD-10-CM | POA: Diagnosis not present

## 2022-11-24 LAB — URINALYSIS, ROUTINE W REFLEX MICROSCOPIC
Bilirubin Urine: NEGATIVE
Glucose, UA: NEGATIVE mg/dL
Hgb urine dipstick: NEGATIVE
Ketones, ur: NEGATIVE mg/dL
Leukocytes,Ua: NEGATIVE
Nitrite: NEGATIVE
Protein, ur: NEGATIVE mg/dL
Specific Gravity, Urine: 1.025 (ref 1.005–1.030)
pH: 6 (ref 5.0–8.0)

## 2022-11-24 NOTE — Discharge Instructions (Signed)
Labs today did show some mild signs of dehydration.  Continue trying to push oral fluids and regular meals. Can try Glucerna given her diabetes to help supplement without increased sugar intake. I have placed an order for home health evaluation for possible home health aide or PT.  They should be contacting you to set up evaluation time. Please follow-up with primary care doctor.  They should be able to review workup from this ER visit. Return here for any new or acute changes.

## 2022-12-22 ENCOUNTER — Ambulatory Visit: Payer: Medicare HMO | Admitting: Physician Assistant

## 2022-12-28 ENCOUNTER — Ambulatory Visit: Payer: Medicare HMO | Admitting: Gastroenterology

## 2022-12-28 ENCOUNTER — Encounter: Payer: Self-pay | Admitting: Gastroenterology

## 2022-12-28 VITALS — BP 138/80 | HR 83 | Ht 64.0 in | Wt 164.0 lb

## 2022-12-28 DIAGNOSIS — K5900 Constipation, unspecified: Secondary | ICD-10-CM | POA: Diagnosis not present

## 2022-12-28 DIAGNOSIS — K649 Unspecified hemorrhoids: Secondary | ICD-10-CM | POA: Diagnosis not present

## 2022-12-28 MED ORDER — HYDROCORTISONE (PERIANAL) 2.5 % EX CREA: 1.0000 | TOPICAL_CREAM | Freq: Two times a day (BID) | CUTANEOUS | 1 refills | Status: DC | PRN

## 2022-12-28 MED ORDER — POLYETHYLENE GLYCOL 3350 17 GM/SCOOP PO POWD: 17.0000 g | Freq: Every day | ORAL | 11 refills | Status: DC

## 2022-12-28 NOTE — Patient Instructions (Addendum)
Hemos enviado los siguientes medicamentos a su farmacia para que los recoja cuando le convenga: Crema de hidrocortisona al 2,5 % inserte una pequea cantidad en el primer nudillo dos veces al da segn sea necesario para las hemorroides.   Comience con Miralax 1 tapa al da en 8 onzas de lquido.  ____________________________________________________________________  We have sent the following medications to your pharmacy for you to pick up at your convenience: Hydrocortisone cream 2.5 % insert small amount to the first knuckle twice daily as needed for hemorrhoids.   Start Miralax 1 capful daily in 8 ounces of liquid.  _______________________________________________________  If your blood pressure at your visit was 140/90 or greater, please contact your primary care physician to follow up on this.  _______________________________________________________  If you are age 84 or older, your body mass index should be between 23-30. Your Body mass index is 28.15 kg/m. If this is out of the aforementioned range listed, please consider follow up with your Primary Care Provider.  If you are age 61 or younger, your body mass index should be between 19-25. Your Body mass index is 28.15 kg/m. If this is out of the aformentioned range listed, please consider follow up with your Primary Care Provider.   ________________________________________________________  The [facility] providers would like to encourage you to use [facility] to communicate with providers for non-urgent requests or questions.  Due to long hold times on the telephone, sending your provider a message by [facility] may be a faster and more efficient way to get a response.  Please allow 48 business hours for a response.  Please remember that this is for non-urgent requests.  _______________________________________________________

## 2022-12-28 NOTE — Progress Notes (Signed)
12/28/2022 Heidi Preston [id number] Aug 18, 1940   HISTORY OF PRESENT ILLNESS: This is an 82 year old female who is primarily Spanish-speaking.  She is here today with her husband who gives the entire history due to her reported history of advanced dementia.  Since they both speak Spanish an interpreter was used during the entirety of the visit.  He says that she does have some constipation.  They use MiraLAX periodically, but do not use it all the time because it is expensive.  He uses on occasion as well.  He is here today primarily to report her issues with hemorrhoids.  He said there is no rectal bleeding and she does not report any pain.  She has been evaluated for hemorrhoids back in September 2023 when she was examined thoroughly at that time.  Colonoscopy 04/2017 showed diverticulosis, otherwise normal.  Past Medical History:  Diagnosis Date   Anxiety    Arthritis    Blepharitis, both eyes    Diabetes mellitus without complication (HCC)    Diverticulosis    Esophagitis, reflux    Fatigue    Hepatic steatosis    Hypercholesteremia    Hypertension    Hypothyroid    Neuromuscular disorder (HCC)    Past Surgical History:  Procedure Laterality Date   CHOLECYSTECTOMY     45 years ago   COLONOSCOPY     SHOULDER SURGERY Right    TOTAL ABDOMINAL HYSTERECTOMY  2012    reports that she has never smoked. She has never used smokeless tobacco. She reports that she does not drink alcohol and does not use drugs. family history is not on file. Allergies  Allergen Reactions   Donepezil Hcl     dizziness   Pravastatin Other (See Comments)    Headache      Outpatient Encounter Medications as of 12/28/2022  Medication Sig   atorvastatin (LIPITOR) 80 MG tablet Take 80 mg by mouth daily.   Cholecalciferol 50 MCG (2000 UT) CAPS Take 1 tablet by mouth daily.   esomeprazole (NEXIUM) 40 MG capsule Take 40 mg by mouth daily before breakfast.   gabapentin (NEURONTIN) 300 MG capsule Take  300 mg by mouth daily.   levothyroxine (SYNTHROID, LEVOTHROID) 50 MCG tablet    loperamide (IMODIUM) 2 MG capsule Take 1 capsule by mouth as needed.   losartan (COZAAR) 25 MG tablet Take 25 mg by mouth daily.   metoprolol tartrate (LOPRESSOR) 25 MG tablet Take 25 mg by mouth daily.   mirtazapine (REMERON) 15 MG tablet Take 15 mg by mouth at bedtime.   sertraline (ZOLOFT) 100 MG tablet Take 1 tablet by mouth daily.   sitaGLIPtin (JANUVIA) 50 MG tablet Take 50 mg by mouth daily.   tiZANidine (ZANAFLEX) 2 MG tablet Take by mouth.   donepezil (ARICEPT) 5 MG tablet Take 5 mg by mouth at bedtime. (Patient not taking: Reported on 12/28/2022)   [DISCONTINUED] aspirin 81 MG EC tablet Take 1 tablet by mouth in the morning.   No facility-administered encounter medications on file as of 12/28/2022.     REVIEW OF SYSTEMS  : All other systems reviewed and negative except where noted in the History of Present Illness.   PHYSICAL EXAM: BP 138/80 (BP Location: Left Arm, Patient Position: Sitting, Cuff Size: Normal)   Pulse 83   Ht 5\' 4"  (1.626 m)   Wt 164 lb (74.4 kg)   SpO2 95%   BMI 28.15 kg/m  General: Well developed female in no acute distress  ASSESSMENT AND PLAN: *82 year old female here with her husband who reports issues with hemorrhoids and constipation.  Previously they had been told to use MiraLAX, which he uses as well, but they said it is expensive.  I can send a prescription see if their insurance will cover it.  Can use hydrocortisone cream to the perianal area as well.  Prescriptions sent to pharmacy.  No complaints of bleeding and no pain.  Patient does not give any history, this is all just coming from her husband due to her advanced dementia.   CC:  Andreas Blower., MD

## 2023-01-01 NOTE — Progress Notes (Signed)
____________________________________________________________  Attending physician addendum:  Thank you for sending this case to me. I have reviewed the entire note and agree with the plan.  Internal HR were seen on exam in office Sept 2023.  Since no bleeding reported at this time, I agree conservative approach is warranted, especially given her advanced dementia. If she has recurrent bleeding, I would be happy to examine her in the office to see if banding may be needed.  Amada Jupiter, MD  ____________________________________________________________

## 2023-01-25 ENCOUNTER — Ambulatory Visit: Payer: Medicare HMO | Admitting: Gastroenterology

## 2023-06-21 ENCOUNTER — Encounter: Payer: Self-pay | Admitting: Gastroenterology

## 2023-06-21 ENCOUNTER — Ambulatory Visit: Admitting: Gastroenterology

## 2023-06-21 VITALS — BP 118/68 | HR 84 | Ht 64.0 in | Wt 173.0 lb

## 2023-06-21 DIAGNOSIS — K625 Hemorrhage of anus and rectum: Secondary | ICD-10-CM

## 2023-06-21 DIAGNOSIS — R1310 Dysphagia, unspecified: Secondary | ICD-10-CM | POA: Diagnosis not present

## 2023-06-21 DIAGNOSIS — R1319 Other dysphagia: Secondary | ICD-10-CM

## 2023-06-21 DIAGNOSIS — K648 Other hemorrhoids: Secondary | ICD-10-CM | POA: Diagnosis not present

## 2023-06-21 MED ORDER — PHENYLEPHRINE IN HARD FAT 0.25 % RE SUPP
1.0000 | Freq: Two times a day (BID) | RECTAL | 2 refills | Status: DC | PRN
Start: 2023-06-21 — End: 2023-08-14

## 2023-06-21 NOTE — Patient Instructions (Addendum)
 Hemos enviado los siguientes medicamentos a su farmacia para que los recoja cuando le resulte conveniente: Supositorio de preparacin H   You have been scheduled for an endoscopy. Please follow written instructions given to you at your visit today.  If you use inhalers (even only as needed), please bring them with you on the day of your procedure.  If you take any of the following medications, they will need to be adjusted prior to your procedure:   DO NOT TAKE 7 DAYS PRIOR TO TEST- Trulicity (dulaglutide) Ozempic, Wegovy (semaglutide) Mounjaro (tirzepatide) Bydureon Bcise (exanatide extended release)  DO NOT TAKE 1 DAY PRIOR TO YOUR TEST Rybelsus (semaglutide) Adlyxin (lixisenatide) Victoza (liraglutide) Byetta (exanatide) ___________________________________________________________________________   Due to recent changes in healthcare laws, you may see the results of your imaging and laboratory studies on MyChart before your provider has had a chance to review them.  We understand that in some cases there may be results that are confusing or concerning to you. Not all laboratory results come back in the same time frame and the provider may be waiting for multiple results in order to interpret others.  Please give us  48 hours in order for your provider to thoroughly review all the results before contacting the office for clarification of your results.   Thank you for choosing me and [facility].  Dr. Lorella Roles

## 2023-06-21 NOTE — Progress Notes (Addendum)
 [facility] Consult Note:  History: Heidi Preston 06/21/2023  Referring provider: Lavenia Post., MD  Reason for consult/chief complaint: Rectal Bleeding (Pt states she has some anal bleeding.) and Dysphagia (Pt states she chokes on everything.)  Summary of pertinent GI issues  Diarrhea probably colonoscopy March 2019.  Diverticulosis. Subsequent office visits for constipation and internal hemorrhoids  Subjective  HPI: Last visit here 12/28/2022 for constipation and hemorrhoidal bleeding Referred back at this time for anal bleeding but more so chronic dysphagia.  _______________  Assessment Summary for MBS Pt transferred to fluoro chair with supervision. Pt eats a Regular diet at baseline. Per EMR, pt's family reports complaints of coughing with PO. Unclear of onset or frequency as pt is not a reliable historian unfortunately. Pt denies dysphagia, odynophagia or pill dysphagia.  Pt presented with a grossly safe and functional oropharyngeal swallow. Mild deficits noted in lingual stripping wave, delayed swallow initiation, reduced epiglottic inversion, reduced base of tongue retraction and diminished pharyngeal stripping wave resulting in mild oropharyngeal dysphagia. No penetration or aspiration visualized. Esophageal screening notable for esophageal retention which improved but did not completely clear with alternating solids and liquids. Consider GI consult.  RECOMMENDATIONS MBS Recommendations: Routine Oral Care, Oral Intake Solid Recommendations: Regular Liquid Recommendations: Thin  Medication Recommendations: As-tolerated  Swallowing/Safe Feeding Strategies: Alternate solids and liquids, Remain upright 20-30 minutes following po intake, Upright as possible for all oral intake  _______________  Heidi Preston was seen today with her husband in attendance who gives the history because of her advanced dementia.  A Spanish interpreter Fernand Howard) was present for the  entire visit as well. It sounds like she had some intermittent rectal bleeding perhaps every couple of weeks.  Her husband really has not noticed whether she seems to have constipation or diarrhea, and she really is unable to tell us .  He has not been using any hemorrhoid suppositories, did not seem to recall the recommendation from previous visits. His greater concern is about 2 years of dysphagia characterized by frequent choking and coughing after consuming almost any food or liquid.  He has not noticed any weight loss.  She does not seem to have abdominal pain or vomiting.  They were uncertain the results of the swallowing study noted above so I conveyed it to them.  ROS:  Cortez voices no other complaints, limited historian.  While she is ambulatory and conversational, her dementia limits her ability to give history.   Past Medical History: Past Medical History:  Diagnosis Date   Anxiety    Arthritis    Blepharitis, both eyes    Diabetes mellitus without complication (HCC)    Diverticulosis    Esophagitis, reflux    Fatigue    Hepatic steatosis    Hypercholesteremia    Hypertension    Hypothyroid    Neuromuscular disorder (HCC)      Past Surgical History: Past Surgical History:  Procedure Laterality Date   CHOLECYSTECTOMY     45 years ago   COLONOSCOPY     SHOULDER SURGERY Right    TOTAL ABDOMINAL HYSTERECTOMY  2012     Family History: Family History  Problem Relation Age of Onset   Colon cancer Neg Hx    Stomach cancer Neg Hx    Esophageal cancer Neg Hx    Rectal cancer Neg Hx    Liver cancer Neg Hx     Social History: Social History   Socioeconomic History   Marital status: Married  Spouse name: Not on file   Number of children: 4   Years of education: Not on file   Highest education level: Not on file  Occupational History   Occupation: housewife  Tobacco Use   Smoking status: Never   Smokeless tobacco: Never  Vaping Use   Vaping status: Never  Used  Substance and Sexual Activity   Alcohol use: No   Drug use: No   Sexual activity: Never  Other Topics Concern   Not on file  Social History Narrative   Not on file   Social Drivers of Health   Financial Resource Strain: Not on file  Food Insecurity: Not on file  Transportation Needs: Not on file  Physical Activity: Not on file  Stress: Not on file  Social Connections: Unknown (07/05/2021)   Received [facility]   Social Network    Social Network: Not on file    Allergies: Allergies  Allergen Reactions   Donepezil Hcl     dizziness   Pravastatin Other (See Comments)    Headache    Outpatient Meds: Current Outpatient Medications  Medication Sig Dispense Refill   atorvastatin  (LIPITOR) 80 MG tablet Take 80 mg by mouth daily.     Cholecalciferol 50 MCG (2000 UT) CAPS Take 1 tablet by mouth daily.     esomeprazole (NEXIUM) 40 MG capsule Take 40 mg by mouth daily before breakfast.     gabapentin (NEURONTIN) 300 MG capsule Take 300 mg by mouth daily.     hydrocortisone  (ANUSOL -HC) 2.5 % rectal cream Place 1 Application rectally 2 (two) times daily as needed for hemorrhoids or anal itching. 30 g 1   levothyroxine (SYNTHROID, LEVOTHROID) 50 MCG tablet      loperamide  (IMODIUM ) 2 MG capsule Take 1 capsule by mouth as needed.     losartan (COZAAR) 25 MG tablet Take 25 mg by mouth daily.     mirtazapine (REMERON) 15 MG tablet Take 15 mg by mouth at bedtime.     phenylephrine  (,USE FOR PREPARATION-H,) 0.25 % suppository Place 1 suppository rectally 2 (two) times daily as needed for hemorrhoids. When bleeding occurs 24 suppository 2   polyethylene glycol powder (GLYCOLAX /MIRALAX ) 17 GM/SCOOP powder Take 17 g by mouth daily. 510 g 11   sertraline (ZOLOFT) 100 MG tablet Take 1 tablet by mouth daily.     sitaGLIPtin (JANUVIA) 50 MG tablet Take 50 mg by mouth daily.     tiZANidine (ZANAFLEX) 2 MG tablet Take by mouth.     donepezil (ARICEPT) 5 MG tablet Take  5 mg by mouth at bedtime. (Patient not taking: Reported on 06/21/2023)     metoprolol  tartrate (LOPRESSOR ) 25 MG tablet Take 25 mg by mouth daily. (Patient not taking: Reported on 06/21/2023)     No current facility-administered medications for this visit.      ___________________________________________________________________ Objective   Exam:  BP 118/68   Pulse 84   Ht 5\' 4"  (1.626 m)   Wt 173 lb (78.5 kg)   BMI 29.70 kg/m  Wt Readings from Last 3 Encounters:  06/21/23 173 lb (78.5 kg)  12/28/22 164 lb (74.4 kg)  11/23/22 165 lb (74.8 kg)   Husband present for entire visit including exam General: Well-appearing Eyes: sclera anicteric, no redness ENT: oral mucosa moist without lesions, no cervical or supraclavicular lymphadenopathy CV: Regular appreciable murmur, no JVD, no peripheral edema Resp: clear to auscultation bilaterally, normal RR and effort noted GI: soft, no tenderness, with active bowel sounds.  No guarding or palpable organomegaly noted. Skin; warm and dry, no rash or jaundice noted Neuro: awake, alert and oriented x 3. Normal gross motor function and fluent speech Perianal exam normal.  DRE normal without fissure or tenderness. Anoscopy reveals internal hemorrhoid, primarily RP column _____________  Radiologic Studies:  Modified barium swallow without evidence of aspiration or penetration.  Please refer to the Speech Pathologists report for complete details and recommendations.   Electronically Signed   By: Myrlene Asper D.O.   On: 05/29/2023 12:19 Narrative  CLINICAL DATA:  Patient with history of advanced dementia and intermittent episodes of choking with oral intake. Request for modified barium swallow to evaluate for possible aspiration.  EXAM: MODIFIED BARIUM SWALLOW  TECHNIQUE: Different consistencies of barium were administered orally to the patient by the Speech Pathologist. Imaging of the pharynx was performed in the lateral  projection. Nathan Bake, PA-C was present in the fluoroscopy room during this study, which was supervised and interpreted by Dr. Myrlene Asper.  FLUOROSCOPY: Radiation Exposure Index (as provided by the fluoroscopic device): 14.70 mGy Kerma  COMPARISON:  None Available.  FINDINGS: Vestibular  Penetration:  None seen.  Aspiration:  None seen.  Other:  Early spillage Procedure Note  Abraham Hoffmann, MD - 05/29/2023 Formatting of this note might be different from the original. CLINICAL DATA:  Patient with history of advanced dementia and intermittent episodes of choking with oral intake. Request for modified barium swallow to evaluate for possible aspiration.  EXAM: MODIFIED BARIUM SWALLOW  TECHNIQUE: Different consistencies of barium were administered orally to the patient by the Speech Pathologist. Imaging of the pharynx was performed in the lateral projection. Nathan Bake, PA-C was present in the fluoroscopy room during this study, which was supervised and interpreted by Dr. Myrlene Asper.  FLUOROSCOPY: Radiation Exposure Index (as provided by the fluoroscopic device): 14.70 mGy Kerma  COMPARISON:  None Available.  FINDINGS: Vestibular  Penetration:  None seen.  Aspiration:  None seen.  Other:  Early spillage  IMPRESSION: Modified barium swallow without evidence of aspiration or penetration.  Please refer to the Speech Pathologists report for complete details and recommendations.   Electronically Signed   By: Myrlene Asper D.O.   On: 05/29/2023 12:19  ________________  There is no report of the patient having had a full barium esophagram.  Assessment: Encounter Diagnoses  Name Primary?   Esophageal dysphagia Yes   Rectal bleeding    Bleeding internal hemorrhoids     Dysphagia this is somewhat difficult to characterize with limited history.  However based on husband's description and that of the speech pathologist during the MBS,  there may be some element of a transfer dysphagia. Looking at the report, they also noticed some barium retention of the proximal esophagus, but the mid to distal esophagus is not visualized on MBS.  Raises suspicion for stricture or perhaps motility disorder.  Hemorrhoidal bleeding, hard to tell what her bowel habits are at this point.  Hemorrhoid would be amenable to hemorrhoidal banding which I briefly discussed and described.  Her husband feels like he may be amenable to doing that, and we will revisit it after attending to her dysphagia.  I prescribed some as needed Preparation H suppositories in the interim.  Plan:  Upper endoscopy with possible dilation.  Her husband was agreeable after thorough discussion of procedure and risks  The benefits and risks of the planned procedure(s) were described in detail with the patient or (when appropriate) their health care proxy.  Risks were outlined as including, but not limited to, bleeding, infection, perforation, adverse medication reaction leading to cardiac or pulmonary decompensation, pancreatitis (if ERCP).  The limitation of incomplete mucosal visualization was also discussed.  No guarantees or warranties were given.   42 minutes were spent on this encounter, including in depth chart review, independent review of results as outlined above, communicating results with the patient directly, face-to-face time with the patient, coordinating care, ordering studies and medications as appropriate, and documentation.  At a time with a history of interpreter used. High degree of medical decision making  Thank you for the courtesy of this consult.  Please call me with any questions or concerns.  Kerby Pearson III  CC: Referring provider noted above

## 2023-07-04 ENCOUNTER — Encounter: Payer: Self-pay | Admitting: Gastroenterology

## 2023-07-12 ENCOUNTER — Ambulatory Visit (AMBULATORY_SURGERY_CENTER): Admitting: Gastroenterology

## 2023-07-12 ENCOUNTER — Encounter: Payer: Self-pay | Admitting: Gastroenterology

## 2023-07-12 ENCOUNTER — Telehealth: Payer: Self-pay

## 2023-07-12 VITALS — BP 149/69 | HR 81 | Temp 97.5°F | Resp 20 | Ht 64.0 in | Wt 173.0 lb

## 2023-07-12 DIAGNOSIS — K2950 Unspecified chronic gastritis without bleeding: Secondary | ICD-10-CM | POA: Diagnosis not present

## 2023-07-12 DIAGNOSIS — K2970 Gastritis, unspecified, without bleeding: Secondary | ICD-10-CM

## 2023-07-12 DIAGNOSIS — R1319 Other dysphagia: Secondary | ICD-10-CM

## 2023-07-12 DIAGNOSIS — R1314 Dysphagia, pharyngoesophageal phase: Secondary | ICD-10-CM | POA: Diagnosis not present

## 2023-07-12 MED ORDER — SODIUM CHLORIDE 0.9 % IV SOLN: 500.0000 mL | Freq: Once | INTRAVENOUS | Status: DC

## 2023-07-12 NOTE — Progress Notes (Signed)
 Interpreter used today at the [facility] for this pt.  Interpreter's name is-Raquel

## 2023-07-12 NOTE — Telephone Encounter (Signed)
 Yes. Patient has dementia, but discussed with her husband at the recent OV and with her daughter at today's [facility] visit.  - H. Danis

## 2023-07-12 NOTE — Telephone Encounter (Signed)
 Patient calling to schedule hemorrhoid banding. Is it okay to schedule this appointment?

## 2023-07-12 NOTE — Progress Notes (Signed)
 No significant changes to clinical history since GI office visit on 06/21/23. Daughter present for pre-procedure evaluation.  The patient is appropriate for an endoscopic procedure in the ambulatory setting.  - Lorella Roles, MD

## 2023-07-12 NOTE — Patient Instructions (Addendum)
 - Resume previous diet.                           - Continue present medications.                           - Await pathology results.  USTED TUVO UN PROCEDIMIENTO ENDOSCPICO HOY EN EL [facility]:   Lea el informe del procedimiento que se le entreg para cualquier pregunta especfica sobre lo que se Dentist.  Si el informe del examen no responde a sus preguntas, por favor llame a su gastroenterlogo para aclararlo.  Si usted solicit que no se le den [employer] de lo que se Clinical cytogeneticist en su procedimiento al [employer] va a cuidar, entonces el informe del procedimiento se ha incluido en un sobre sellado para que usted lo revise despus cuando le sea ms conveniente.   LO QUE PUEDE ESPERAR: Algunas sensaciones de hinchazn en el abdomen.  Puede tener ms gases de lo normal.  El caminar puede ayudarle a eliminar el aire que se le puso en el tracto gastrointestinal durante el procedimiento y reducir la hinchazn.  Si le hicieron una endoscopia inferior (como una colonoscopia o una sigmoidoscopia flexible), podra notar manchas de sangre en las heces fecales o en el papel higinico.  Si se someti a una preparacin intestinal para su procedimiento, es posible que no tenga una evacuacin intestinal normal durante [employer].   Tenga en cuenta:  Es posible que note un poco de irritacin y congestin en la nariz o algn drenaje.  Esto es debido al oxgeno [employer] durante su procedimiento.  No hay que preocuparse y esto debe desaparecer ms o Regulatory affairs officer.   Despus de la endoscopia superior (EGD)  Vmitos de Retail buyer o material como caf molido   Dolor en el pecho o dolor debajo de los omplatos que antes no tena   Dolor o dificultad persistente para tragar  Falta de aire que antes no tena   Fiebre de 100F o ms  Heces fecales negras y pegajosas   Para asuntos urgentes o de Associate Professor, puede comunicarse con un gastroenterlogo a  cualquier hora llamando al [phone].  DIETA:  Recomendamos una comida pequea al principio, pero luego puede continuar con su dieta normal.  Tome muchos lquidos, pero debe evitar las bebidas alcohlicas durante 24 horas.    ACTIVIDAD:  Debe planear tomarse las cosas con calma por el resto del da y no debe CONDUCIR ni usar maquinaria pesada Patent examiner (debido a los medicamentos de sedacin utilizados durante el examen).     SEGUIMIENTO: Nuestro personal llamar al nmero que aparece en su historial al siguiente da hbil de su procedimiento para ver cmo se siente y para responder cualquier pregunta o inquietud que pueda tener con respecto a la informacin que se le dio despus del procedimiento. Si no podemos contactarle, le dejaremos un mensaje.  Sin embargo, si se siente bien y no tiene English as a second language teacher, no es necesario que nos devuelva la llamada.  Asumiremos que ha regresado a sus actividades diarias normales sin incidentes. Si se le tomaron algunas biopsias, le contactaremos por telfono o por carta en las prximas 3 semanas.  Si no ha sabido [employer]  biopsias en el transcurso de 3 semanas, por favor llmenos al [phone].   FIRMAS/CONFIDENCIALIDAD: Usted y/o el acompaante que le cuide han firmado documentos que se ingresarn en su historial mdico electrnico.  Estas firmas atestiguan el hecho de que la informacin anterior

## 2023-07-12 NOTE — Progress Notes (Signed)
 Called to room to assist during endoscopic procedure.  Patient ID and intended procedure confirmed with present staff. Received instructions for my participation in the procedure from the performing physician.

## 2023-07-12 NOTE — Op Note (Signed)
 [facility] Patient Name: Heidi Preston Procedure Date: 07/12/2023 10:28 AM MRN: [id number] Endoscopist: Ace Abu L. Dominic Friendly , MD, [id number] Age: 83 Referring MD:  Date of Birth: 1940-10-30 Gender: Female Account #: [id number] Procedure:                Upper GI endoscopy Indications:              Dysphagia, Abnormal cine-esophagram                           Clinical details in 06/21/2023 office consult note Medicines:                Monitored Anesthesia Care Procedure:                Pre-Anesthesia Assessment:                           - Prior to the procedure, a History and Physical                            was performed, and patient medications and                            allergies were reviewed. The patient's tolerance of                            previous anesthesia was also reviewed. The risks                            and benefits of the procedure and the sedation                            options and risks were discussed with the patient.                            All questions were answered, and informed consent                            was obtained. Prior Anticoagulants: The patient has                            taken no anticoagulant or antiplatelet agents. ASA                            Grade Assessment: III - A patient with severe                            systemic disease. After reviewing the risks and                            benefits, the patient was deemed in satisfactory                            condition to undergo the procedure.  After obtaining informed consent, the endoscope was                            passed under direct vision. Throughout the                            procedure, the patient's blood pressure, pulse, and                            oxygen saturations were monitored continuously. The                            Olympus Scope [id number] was introduced through the                            mouth, and  advanced to the second part of duodenum.                            The upper GI endoscopy was accomplished without                            difficulty. The patient tolerated the procedure                            well. Scope In: Scope Out: Findings:                 The larynx was normal other than secretions.                           There is no endoscopic evidence of Barrett's                            esophagus, esophagitis, hiatal hernia, stricture,                            areas of dilation or lower esophageal sphincter                            tone abnormalities in the entire esophagus.                           Normal mucosa was found in the entire esophagus.                           Due to reported symptoms and report of proximal                            esophageal barium retention on MBS, decision was                            made to perform an empiric dilation.                           A TTS dilator was passed through  the scope.                            Dilation with a 15-16.5-18 mm balloon dilator was                            performed to 18 mm. The dilation site was examined                            and showed no change.                           Localized mild mucosal changes characterized by an                            increased vascular pattern were found in the                            prepyloric region of the stomach. Several biopsies                            were obtained on the greater curvature of the                            gastric body, on the lesser curvature of the                            gastric body, on the greater curvature of the                            gastric antrum and on the lesser curvature of the                            gastric antrum with cold forceps for histology.                            (All biopsies in 1 pathology jar, rule out H.                            pylori)                           The exam of the  stomach was otherwise normal,                            including on retroflexion. Distended well with                            insufflation.                           The examined duodenum was normal. Complications:            No immediate complications. Estimated Blood Loss:     Estimated blood loss was minimal. Impression:               -  Normal larynx.                           - Normal mucosa was found in the entire esophagus.                            Dilated.                           - Increased vascular pattern mucosa in the                            prepyloric region of the stomach. 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                           - Normal examined duodenum.                           - Several biopsies were obtained on the greater                            curvature of the gastric body, on the lesser                            curvature of the gastric body, on the greater                            curvature of the gastric antrum and on the lesser                            curvature of the gastric antrum.                           This patient's reported coughing while eating is                            most consistent with the transfer dysphagia                            described on MBS. Recommendation:           - Patient has a contact number available for                            emergencies. The signs and symptoms of potential                            delayed complications were discussed with the                            patient. Return to normal activities tomorrow.  Written discharge instructions were provided to the                            patient.                           - Resume previous diet.                           - Continue present medications.                           - Await pathology results. [name] L. Dominic Friendly, MD 07/12/2023 10:51:52 AM This report has been  signed electronically.

## 2023-07-12 NOTE — Progress Notes (Signed)
 Pt's states no medical or surgical changes since previsit or office visit.

## 2023-07-12 NOTE — Progress Notes (Signed)
 Sedate, gd SR, tolerated procedure well, VSS, report to RN

## 2023-07-13 ENCOUNTER — Telehealth: Payer: Self-pay | Admitting: *Deleted

## 2023-07-13 NOTE — Telephone Encounter (Signed)
  Follow up Call-     07/12/2023   10:02 AM  Call back number  Post procedure Call Back phone  # [phone] (spouse)  Permission to leave phone message Yes     Patient questions:   Message left to call if necessary.

## 2023-07-18 ENCOUNTER — Ambulatory Visit: Payer: Self-pay | Admitting: Gastroenterology

## 2023-07-18 LAB — SURGICAL PATHOLOGY

## 2023-08-14 ENCOUNTER — Ambulatory Visit (INDEPENDENT_AMBULATORY_CARE_PROVIDER_SITE_OTHER): Admitting: Gastroenterology

## 2023-08-14 ENCOUNTER — Encounter: Payer: Self-pay | Admitting: Gastroenterology

## 2023-08-14 VITALS — BP 110/70 | HR 82 | Ht 64.0 in | Wt 170.0 lb

## 2023-08-14 DIAGNOSIS — K648 Other hemorrhoids: Secondary | ICD-10-CM | POA: Diagnosis not present

## 2023-08-14 NOTE — Progress Notes (Signed)
 PROCEDURE NOTE: The patient presents with symptomatic grade 2  hemorrhoids, requesting rubber band ligation of his/her hemorrhoidal disease.  All risks, benefits and alternative forms of therapy were described and informed consent was obtained.  DRE revealed: heightened ST  CMA PJ, Daughter and female interpreter Rick) present)  The anorectum was pre-medicated with 0.125% NTG and lubricant. The decision was made to band the RP internal hemorrhoids, and the [facility] O'Regan System was used to perform band ligation without complication.  Digital anorectal examination was then performed to assure proper positioning of the band, and to adjust the banded tissue as required.  The patient was discharged home without pain or other issues.  Dietary and behavioral recommendations were given and along with follow-up instructions.     The following adjunctive treatments were recommended:  none  The patient will return 2-3 weeks  for  follow-up and possible additional banding as required. No complications were encountered and the patient tolerated the procedure well.  GLENWOOD Victory Brand, MD

## 2023-08-14 NOTE — Patient Instructions (Signed)
 HEMORRHOID BANDING PROCEDURE    FOLLOW-UP CARE   The procedure you have had should have been relatively painless since the banding of the area involved does not have nerve endings and there is no pain sensation.  The rubber band cuts off the blood supply to the hemorrhoid and the band may fall off as soon as 48 hours after the banding (the band may occasionally be seen in the toilet bowl following a bowel movement). You may notice a temporary feeling of fullness in the rectum which should respond adequately to plain Tylenol  or Motrin .  Following the banding, avoid strenuous exercise that evening and resume full activity the next day.  A sitz bath (soaking in a warm tub) or bidet is soothing, and can be useful for cleansing the area after bowel movements.     To avoid constipation, take two tablespoons of natural wheat bran, natural oat bran, flax, Benefiber or any over the counter fiber supplement and increase your water intake to 7-8 glasses daily.    Unless you have been prescribed anorectal medication, do not put anything inside your rectum for two weeks: No suppositories, enemas, fingers, etc.  Occasionally, you may have more bleeding than usual after the banding procedure.  This is often from the untreated hemorrhoids rather than the treated one.  Don't be concerned if there is a tablespoon or so of blood.  If there is more blood than this, lie flat with your bottom higher than your head and apply an ice pack to the area. If the bleeding does not stop within a half an hour or if you feel faint, call our office at [phone] or go to the emergency room.  Problems are not common; however, if there is a substantial amount of bleeding, severe pain, chills, fever or difficulty passing urine (very rare) or other problems, you should call us  at [phone] or report to the nearest emergency room.  Do not stay seated continuously for more than 2-3 hours for a day or two after the procedure.   Tighten your buttock muscles 10-15 times every two hours and take 10-15 deep breaths every 1-2 hours.  Do not spend more than a few minutes on the toilet if you cannot empty your bowel; instead re-visit the toilet at a later time.   _______________________________________________________  If your blood pressure at your visit was 140/90 or greater, please contact your primary care physician to follow up on this.  _______________________________________________________  If you are age 70 or older, your body mass index should be between 23-30. Your Body mass index is 29.18 kg/m. If this is out of the aforementioned range listed, please consider follow up with your Primary Care Provider.  If you are age 3 or younger, your body mass index should be between 19-25. Your Body mass index is 29.18 kg/m. If this is out of the aformentioned range listed, please consider follow up with your Primary Care Provider.   ________________________________________________________  The [facility] providers would like to encourage you to use MYCHART to communicate with providers for non-urgent requests or questions.  Due to long hold times on the telephone, sending your provider a message by [facility] may be a faster and more efficient way to get a response.  Please allow 48 business hours for a response.  Please remember that this is for non-urgent requests.  _______________________________________________________  Thank you for trusting me with your gastrointestinal care!    Dr. Victory Legrand [facility]

## 2023-09-09 NOTE — Progress Notes (Unsigned)
 [facility]  PATIENT: Heidi Preston DOB: 1940/04/14  REFERRING DOCTOR OR PCP: Murray Amos,  MD SOURCE: Patient, notes from primary care, imaging and lab reports, CT scan images personally reviewed.  _________________________________   HISTORICAL  CHIEF COMPLAINT:  No chief complaint on file.   HISTORY OF PRESENT ILLNESS:  I had the pleasure of seeing your patient, Heidi Preston, at [facility] for neurologic consultation regarding her dementia.  She is an 83 year old woman who   Imaging personally reviewed: CT scan of the head 05/16/2018 showed mild age-related generalized cortical atrophy and mild chronic microvascular ischemic changes.  There were no acute findings.  REVIEW OF SYSTEMS: Constitutional: No fevers, chills, sweats, or change in appetite Eyes: No visual changes, double vision, eye pain Ear, nose and throat: No hearing loss, ear pain, nasal congestion, sore throat Cardiovascular: No chest pain, palpitations Respiratory:  No shortness of breath at rest or with exertion.   No wheezes GastrointestinaI: No nausea, vomiting, diarrhea, abdominal pain, fecal incontinence Genitourinary:  No dysuria, urinary retention or frequency.  No nocturia. Musculoskeletal:  No neck pain, back pain Integumentary: No rash, pruritus, skin lesions Neurological: as above Psychiatric: No depression at this time.  No anxiety Endocrine: No palpitations, diaphoresis, change in appetite, change in weigh or increased thirst Hematologic/Lymphatic:  No anemia, purpura, petechiae. Allergic/Immunologic: No itchy/runny eyes, nasal congestion, recent allergic reactions, rashes  ALLERGIES: Allergies  Allergen Reactions   Pravastatin Other (See Comments)    Headache   Donepezil Hcl Other (See Comments)    dizziness    HOME MEDICATIONS:  Current Outpatient Medications:    amLODipine (NORVASC) 10 MG tablet, Take 10 mg by mouth daily., Disp: , Rfl:     atorvastatin  (LIPITOR) 80 MG tablet, Take 80 mg by mouth daily., Disp: , Rfl:    esomeprazole (NEXIUM) 40 MG capsule, Take 40 mg by mouth daily before breakfast., Disp: , Rfl:    GEMTESA 75 MG TABS, Take 1 tablet by mouth daily., Disp: , Rfl:    levothyroxine (SYNTHROID, LEVOTHROID) 50 MCG tablet, , Disp: , Rfl:    losartan (COZAAR) 25 MG tablet, Take 25 mg by mouth daily., Disp: , Rfl:    memantine  (NAMENDA ) 5 MG tablet, Take 5 mg by mouth daily., Disp: , Rfl:    Potassium Chloride ER 20 MEQ TBCR, Take 1 tablet by mouth daily., Disp: , Rfl:    sertraline (ZOLOFT) 100 MG tablet, Take 1 tablet by mouth daily., Disp: , Rfl:    sitaGLIPtin (JANUVIA) 50 MG tablet, Take 50 mg by mouth daily., Disp: , Rfl:   PAST MEDICAL HISTORY: Past Medical History:  Diagnosis Date   Alzheimer's dementia (HCC)    Anxiety    Arthritis    Blepharitis, both eyes    Diabetes mellitus without complication (HCC)    Diverticulosis    Esophagitis, reflux    Fatigue    Hepatic steatosis    Hypercholesteremia    Hypertension    Hypothyroid    Neuromuscular disorder (HCC)     PAST SURGICAL HISTORY: Past Surgical History:  Procedure Laterality Date   CHOLECYSTECTOMY     45 years ago   COLONOSCOPY     SHOULDER SURGERY Right    TOTAL ABDOMINAL HYSTERECTOMY  2012    FAMILY HISTORY: Family History  Problem Relation Age of Onset   Colon cancer Neg Hx    Stomach cancer Neg Hx    Esophageal cancer Neg Hx    Rectal cancer Neg  Hx    Liver cancer Neg Hx    Colon polyps Neg Hx     SOCIAL HISTORY: Social History   Socioeconomic History   Marital status: Married    Spouse name: Not on file   Number of children: 4   Years of education: Not on file   Highest education level: Not on file  Occupational History   Occupation: housewife  Tobacco Use   Smoking status: Never   Smokeless tobacco: Never  Vaping Use   Vaping status: Never Used  Substance and Sexual Activity   Alcohol use: No   Drug use:  No   Sexual activity: Never  Other Topics Concern   Not on file  Social History Narrative   Not on file   Social Drivers of Health   Financial Resource Strain: Not on file  Food Insecurity: Not on file  Transportation Needs: Not on file  Physical Activity: Not on file  Stress: Not on file  Social Connections: Unknown (07/05/2021)   [facility] from [facility]   Social Network    Social Network: Not on file  Intimate Partner Violence: Unknown (05/27/2021)   Received from [facility]   HITS    Physically Hurt: Not on file    Insult or Talk Down To: Not on file    Threaten Physical Harm: Not on file    Scream or Curse: Not on file       PHYSICAL EXAM  There were no vitals filed for this visit.  There is no height or weight on file to calculate BMI.   General: The patient is well-developed and well-nourished and in no acute distress  HEENT:  Head is [place]/AT.  Sclera are anicteric.  Funduscopic exam shows normal optic discs and retinal vessels.  Neck: No carotid bruits are noted.  The neck is nontender.  Cardiovascular: The heart has a regular rate and rhythm with a normal S1 and S2. There were no murmurs, gallops or rubs.    Skin: Extremities are without rash or  edema.  Musculoskeletal:  Back is nontender  Neurologic Exam  Mental status: The patient is alert and oriented x 3 at the time of the examination. The patient has apparent normal recent and remote memory, with an apparently normal attention span and concentration ability.   Speech is normal.  Cranial nerves: Extraocular movements are full. Pupils are equal, round, and reactive to light and accomodation.  Visual fields are full.  Facial symmetry is present. There is good facial sensation to soft touch bilaterally.Facial strength is normal.  Trapezius and sternocleidomastoid strength is normal. No dysarthria is noted.  The tongue is midline, and the patient has symmetric elevation of the soft palate. No obvious  hearing deficits are noted.  Motor:  Muscle bulk is normal.   Tone is normal. Strength is  5 / 5 in all 4 extremities.   Sensory: Sensory testing is intact to pinprick, soft touch and vibration sensation in all 4 extremities.  Coordination: Cerebellar testing reveals good finger-nose-finger and heel-to-shin bilaterally.  Gait and station: Station is normal.   Gait is normal. Tandem gait is normal. Romberg is negative.   Reflexes: Deep tendon reflexes are symmetric and normal bilaterally.   Plantar responses are flexor.    DIAGNOSTIC DATA (LABS, IMAGING, TESTING) - I reviewed patient records, labs, notes, testing and imaging myself where available.  Lab Results  Component Value Date   WBC 13.0 (H) 11/23/2022   HGB 12.6 11/23/2022   HCT  39.7 11/23/2022   MCV 95.9 11/23/2022   PLT 204 11/23/2022      Component Value Date/Time   NA 137 11/23/2022 2102   NA 141 03/06/2017 1115   K 3.8 11/23/2022 2102   CL 102 11/23/2022 2102   CO2 22 11/23/2022 2102   GLUCOSE 249 (H) 11/23/2022 2102   BUN 28 (H) 11/23/2022 2102   BUN 22 03/06/2017 1115   CREATININE 1.20 (H) 11/23/2022 2102   CALCIUM  9.2 11/23/2022 2102   PROT 7.1 12/19/2018 1345   PROT 7.1 03/06/2017 1115   ALBUMIN 4.1 12/19/2018 1345   ALBUMIN 4.3 03/06/2017 1115   AST 18 12/19/2018 1345   ALT 17 12/19/2018 1345   ALKPHOS 52 12/19/2018 1345   BILITOT 0.2 12/19/2018 1345   BILITOT 0.3 03/06/2017 1115   GFRNONAA 45 (L) 11/23/2022 2102   GFRAA >60 07/29/2018 1043   Lab Results  Component Value Date   CHOL 136 03/06/2017   HDL 40 03/06/2017   LDLCALC 57 03/06/2017   TRIG 193 (H) 03/06/2017   CHOLHDL 3.4 03/06/2017   Lab Results  Component Value Date   HGBA1C 6.1 03/06/2017   No results found for: [id number] Lab Results  Component Value Date   TSH 1.261 05/16/2018       ASSESSMENT AND PLAN  ***   Margarett Viti A. Vear, MD, Teola RENO 09/09/2023, 2:17 PM Certified in Neurology, Clinical Neurophysiology,  Sleep Medicine and Neuroimaging  [facility] [address] [place], [place] [address] [phone]

## 2023-09-10 ENCOUNTER — Telehealth: Payer: Self-pay | Admitting: Neurology

## 2023-09-10 ENCOUNTER — Ambulatory Visit (INDEPENDENT_AMBULATORY_CARE_PROVIDER_SITE_OTHER): Admitting: Neurology

## 2023-09-10 ENCOUNTER — Encounter: Payer: Self-pay | Admitting: Neurology

## 2023-09-10 VITALS — BP 113/66 | HR 79 | Ht 64.0 in | Wt 171.0 lb

## 2023-09-10 DIAGNOSIS — F03A0 Unspecified dementia, mild, without behavioral disturbance, psychotic disturbance, mood disturbance, and anxiety: Secondary | ICD-10-CM | POA: Diagnosis not present

## 2023-09-10 DIAGNOSIS — E538 Deficiency of other specified B group vitamins: Secondary | ICD-10-CM | POA: Diagnosis not present

## 2023-09-10 DIAGNOSIS — R260 Ataxic gait: Secondary | ICD-10-CM

## 2023-09-10 DIAGNOSIS — Z1211 Encounter for screening for malignant neoplasm of colon: Secondary | ICD-10-CM | POA: Insufficient documentation

## 2023-09-10 MED ORDER — MEMANTINE HCL 10 MG PO TABS: 5.0000 mg | ORAL_TABLET | Freq: Every day | ORAL | 11 refills | Status: DC

## 2023-09-10 NOTE — Telephone Encounter (Signed)
 sent to GI they obtain [facility] Berkley Harvey [phone]

## 2023-09-11 ENCOUNTER — Ambulatory Visit: Payer: Self-pay | Admitting: Neurology

## 2023-09-11 LAB — VITAMIN B12: Vitamin B-12: 609 pg/mL (ref 232–1245)

## 2023-09-12 ENCOUNTER — Ambulatory Visit (INDEPENDENT_AMBULATORY_CARE_PROVIDER_SITE_OTHER): Admitting: Gastroenterology

## 2023-09-12 ENCOUNTER — Encounter: Payer: Self-pay | Admitting: Gastroenterology

## 2023-09-12 VITALS — BP 122/78 | HR 81 | Ht 64.0 in | Wt 173.0 lb

## 2023-09-12 DIAGNOSIS — K648 Other hemorrhoids: Secondary | ICD-10-CM

## 2023-09-12 DIAGNOSIS — K641 Second degree hemorrhoids: Secondary | ICD-10-CM

## 2023-09-12 NOTE — Progress Notes (Signed)
  Bleeding reportedly decreased after 1st banding  PROCEDURE NOTE: The patient presents with symptomatic grade 2  hemorrhoids, requesting rubber band ligation of his/her hemorrhoidal disease.  All risks, benefits and alternative forms of therapy were described and informed consent was obtained.  DRE revealed: nml  Spanish interpreter and granddaughter Olam present for visit  The anorectum was pre-medicated with 0.125% NTG and lubricant. The decision was made to band the RA internal hemorrhoids, and the [facility] O'Regan System was used to perform band ligation without complication.  Digital anorectal examination was then performed to assure proper positioning of the band, and to adjust the banded tissue as required.  The patient was discharged home without pain or other issues.  Dietary and behavioral recommendations were given and along with follow-up instructions.     The following adjunctive treatments were recommended:  none  The patient will return several weeks  for  follow-up and possible additional banding as required. No complications were encountered and the patient tolerated the procedure well.  GLENWOOD Victory Brand, MD

## 2023-09-12 NOTE — Patient Instructions (Addendum)
 HEMORRHOID BANDING PROCEDURE    FOLLOW-UP CARE   The procedure you have had should have been relatively painless since the banding of the area involved does not have nerve endings and there is no pain sensation.  The rubber band cuts off the blood supply to the hemorrhoid and the band may fall off as soon as 48 hours after the banding (the band may occasionally be seen in the toilet bowl following a bowel movement). You may notice a temporary feeling of fullness in the rectum which should respond adequately to plain Tylenol  or Motrin .  Following the banding, avoid strenuous exercise that evening and resume full activity the next day.  A sitz bath (soaking in a warm tub) or bidet is soothing, and can be useful for cleansing the area after bowel movements.     To avoid constipation, take two tablespoons of natural wheat bran, natural oat bran, flax, Benefiber or any over the counter fiber supplement and increase your water intake to 7-8 glasses daily.    Unless you have been prescribed anorectal medication, do not put anything inside your rectum for two weeks: No suppositories, enemas, fingers, etc.  Occasionally, you may have more bleeding than usual after the banding procedure.  This is often from the untreated hemorrhoids rather than the treated one.  Don't be concerned if there is a tablespoon or so of blood.  If there is more blood than this, lie flat with your bottom higher than your head and apply an ice pack to the area. If the bleeding does not stop within a half an hour or if you feel faint, call our office at [phone] or go to the emergency room.  Problems are not common; however, if there is a substantial amount of bleeding, severe pain, chills, fever or difficulty passing urine (very rare) or other problems, you should call us  at [phone] or report to the nearest emergency room.  Do not stay seated continuously for more than 2-3 hours for a day or two after the  procedure.  Tighten your buttock muscles 10-15 times every two hours and take 10-15 deep breaths every 1-2 hours.  Do not spend more than a few minutes on the toilet if you cannot empty your bowel; instead re-visit the toilet at a later time.   _______________________________________________________  If your blood pressure at your visit was 140/90 or greater, please contact your primary care physician to follow up on this.  _______________________________________________________  If you are age 85 or older, your body mass index should be between 23-30. Your Body mass index is 29.7 kg/m. If this is out of the aforementioned range listed, please consider follow up with your Primary Care Provider.  If you are age 85 or younger, your body mass index should be between 19-25. Your Body mass index is 29.7 kg/m. If this is out of the aformentioned range listed, please consider follow up with your Primary Care Provider.   ________________________________________________________  The [facility] providers would like to encourage you to use MYCHART to communicate with providers for non-urgent requests or questions.  Due to long hold times on the telephone, sending your provider a message by [facility] may be a faster and more efficient way to get a response.  Please allow 48 business hours for a response.  Please remember that this is for non-urgent requests.  _______________________________________________________  [facility] is using a team-based approach to care.  Your team is made up of your doctor and  two to three APPS. Our APPS (Nurse Practitioners and Physician Assistants) work with your physician to ensure care continuity for you. They are fully qualified to address your health concerns and develop a treatment plan. They communicate directly with your gastroenterologist to care for you. Seeing the Advanced Practice Practitioners on your physician's team can help you by facilitating care  more promptly, often allowing for earlier appointments, access to diagnostic testing, procedures, and other specialty referrals.   Thank you for trusting me with your gastrointestinal care!    Dr. Victory Legrand [facility]

## 2023-09-14 ENCOUNTER — Encounter: Payer: Self-pay | Admitting: Neurology

## 2023-10-03 ENCOUNTER — Ambulatory Visit
Admission: RE | Admit: 2023-10-03 | Discharge: 2023-10-03 | Disposition: A | Discharge status: Home / Self Care | Source: Ambulatory Visit | Attending: Neurology | Admitting: Neurology

## 2023-10-03 DIAGNOSIS — F03A0 Unspecified dementia, mild, without behavioral disturbance, psychotic disturbance, mood disturbance, and anxiety: Secondary | ICD-10-CM

## 2023-10-03 DIAGNOSIS — R269 Unspecified abnormalities of gait and mobility: Secondary | ICD-10-CM | POA: Diagnosis not present

## 2023-10-04 NOTE — Telephone Encounter (Addendum)
 Called the interpreter line and interpreter LVM for pt to call back for the below MRI Results.   ----- Message from Charlie DELENA Crete sent at 10/03/2023  5:46 PM EDT ----- Please let them know that the MRI of the cervical spine looks okay.  There is no pressure on the spinal cord or anything that would affect her walking.  She does have some arthritic/degenerative  change which might cause some neck pain or arm pain but nothing bad enough for surgery ----- Message ----- From: [place] Charlie DELENA, MD Sent: 10/03/2023   5:28 PM EDT To: Charlie DELENA Crete, MD

## 2023-10-04 NOTE — Telephone Encounter (Signed)
 Daughter called band and relayed MRI results to her.

## 2023-10-09 ENCOUNTER — Ambulatory Visit (INDEPENDENT_AMBULATORY_CARE_PROVIDER_SITE_OTHER): Admitting: Gastroenterology

## 2023-10-09 ENCOUNTER — Encounter: Payer: Self-pay | Admitting: Gastroenterology

## 2023-10-09 VITALS — BP 110/76 | HR 80 | Ht 64.0 in | Wt 171.0 lb

## 2023-10-09 DIAGNOSIS — K641 Second degree hemorrhoids: Secondary | ICD-10-CM | POA: Diagnosis not present

## 2023-10-09 DIAGNOSIS — K648 Other hemorrhoids: Secondary | ICD-10-CM

## 2023-10-09 NOTE — Progress Notes (Signed)
PROCEDURE NOTE: The patient presents with symptomatic grade 2  hemorrhoids, requesting rubber band ligation of his/her hemorrhoidal disease.  All risks, benefits and alternative forms of therapy were described and informed consent was obtained.  DRE revealed: nml   The anorectum was pre-medicated with 0.125% NTG and lubricant. The decision was made to band the LL internal hemorrhoids, and the [facility] O'Regan System was used to perform band ligation without complication.  Digital anorectal examination was then performed to assure proper positioning of the band, and to adjust the banded tissue as required.  The patient was discharged home without pain or other issues.  Dietary and behavioral recommendations were given and along with follow-up instructions.     The following adjunctive treatments were recommended:  none  The patient will return prn  for  follow-up and possible additional banding as required. No complications were encountered and the patient tolerated the procedure well.  - Markelle Asaro Danis, MD  

## 2023-10-09 NOTE — Patient Instructions (Signed)
 HEMORRHOID BANDING PROCEDURE    FOLLOW-UP CARE   The procedure you have had should have been relatively painless since the banding of the area involved does not have nerve endings and there is no pain sensation.  The rubber band cuts off the blood supply to the hemorrhoid and the band may fall off as soon as 48 hours after the banding (the band may occasionally be seen in the toilet bowl following a bowel movement). You may notice a temporary feeling of fullness in the rectum which should respond adequately to plain Tylenol  or Motrin .  Following the banding, avoid strenuous exercise that evening and resume full activity the next day.  A sitz bath (soaking in a warm tub) or bidet is soothing, and can be useful for cleansing the area after bowel movements.     To avoid constipation, take two tablespoons of natural wheat bran, natural oat bran, flax, Benefiber or any over the counter fiber supplement and increase your water intake to 7-8 glasses daily.    Unless you have been prescribed anorectal medication, do not put anything inside your rectum for two weeks: No suppositories, enemas, fingers, etc.  Occasionally, you may have more bleeding than usual after the banding procedure.  This is often from the untreated hemorrhoids rather than the treated one.  Don't be concerned if there is a tablespoon or so of blood.  If there is more blood than this, lie flat with your bottom higher than your head and apply an ice pack to the area. If the bleeding does not stop within a half an hour or if you feel faint, call our office at [phone] or go to the emergency room.  Problems are not common; however, if there is a substantial amount of bleeding, severe pain, chills, fever or difficulty passing urine (very rare) or other problems, you should call us  at [phone] or report to the nearest emergency room.  Do not stay seated continuously for more than 2-3 hours for a day or two after the procedure.   Tighten your buttock muscles 10-15 times every two hours and take 10-15 deep breaths every 1-2 hours.  Do not spend more than a few minutes on the toilet if you cannot empty your bowel; instead re-visit the toilet at a later time.   _______________________________________________________  If your blood pressure at your visit was 140/90 or greater, please contact your primary care physician to follow up on this.  _______________________________________________________  If you are age 85 or older, your body mass index should be between 23-30. Your Body mass index is 29.35 kg/m. If this is out of the aforementioned range listed, please consider follow up with your Primary Care Provider.  If you are age 85 or younger, your body mass index should be between 19-25. Your Body mass index is 29.35 kg/m. If this is out of the aformentioned range listed, please consider follow up with your Primary Care Provider.   ________________________________________________________  The [facility] providers would like to encourage you to use MYCHART to communicate with providers for non-urgent requests or questions.  Due to long hold times on the telephone, sending your provider a message by [facility] may be a faster and more efficient way to get a response.  Please allow 48 business hours for a response.  Please remember that this is for non-urgent requests.  _______________________________________________________  [facility] is using a team-based approach to care.  Your team is made up of your doctor and two  to three APPS. Our APPS (Nurse Practitioners and Physician Assistants) work with your physician to ensure care continuity for you. They are fully qualified to address your health concerns and develop a treatment plan. They communicate directly with your gastroenterologist to care for you. Seeing the Advanced Practice Practitioners on your physician's team can help you by facilitating care more  promptly, often allowing for earlier appointments, access to diagnostic testing, procedures, and other specialty referrals.    Thank you for trusting me with your gastrointestinal care!    Dr. Victory Legrand DOUGLAS [facility]

## 2023-12-03 ENCOUNTER — Encounter: Payer: Self-pay | Admitting: Neurology

## 2023-12-04 ENCOUNTER — Other Ambulatory Visit: Payer: Self-pay | Admitting: Neurology

## 2023-12-04 MED ORDER — SERTRALINE HCL 100 MG PO TABS: 100.0000 mg | ORAL_TABLET | Freq: Every day | ORAL | 3 refills | Status: AC

## 2023-12-10 ENCOUNTER — Encounter: Payer: Self-pay | Admitting: Neurology

## 2024-02-11 NOTE — Progress Notes (Signed)
 "  Chief Complaint: History of hemorrhoid banding  HPI:    Heidi Preston is an 83 year old Hispanic female, known to Dr. Legrand, with a past medical history as listed below including anxiety, neuromuscular disorder and diabetes, who was referred to me by Delilah Murray HERO., MD for a complaint of history of hemorrhoid banding.     04/2017 colonoscopy with diverticulosis.    06/21/2023 office visit with Dr. Legrand for rectal bleeding and dysphagia.  At that time patient seen with her husband who gave history given dementia via Spanish interpreter.  At that time greatest concern is dysphagia.  At that time discussed possibility of transfer dysphagia.  Also some barium retention at the proximal esophagus.  Hemorrhoid bleeding.  At the time recommended an EGD with possible dilation.    07/12/2023 EGD with empiric dilation up to 18 mm.  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  Discussed that patient's reported coughing while eating was most consistent with transfer dysphagia described on MBS.  Pathology showed mild chronic inactive gastritis and chemical/reactive change.    08/10/2023-10/09/2023 and this time range patient came in for 3 separate hemorrhoid banding's.    10/03/2023 MRI of the cervical spine showed multilevel degenerative changes.  Foraminal narrowing.  C6-C7 moderately severe right foraminal narrowing that could affect the right C7 nerve root.    12/03/2023 abdominal x-ray with moderate stool burden suggesting constipation.    01/04/2024 BMP with a glucose of 230.  CBC with a hemoglobin of 11.8.  TSH normal.  Folate, RBC and vitamin B12 normal.     Today, patient presents to clinic accompanied by her son and interpreter.  Unfortunately her appointment is scheduled for rectal prolapse but I cannot find any records of this anywhere.  In fact I cannot exactly see why she is being seen here in clinic today and her son is not the one that she lives with.   He tells me his sisters take care of her.  To his knowledge she has not had any further rectal bleeding since hemorrhoid banding and has no GI issues.  We reviewed imaging as above which showed some constipation, but he is not aware that this is a problem.  Patient has Alzheimer's and unable to tell me if anything is going on.  Denies any symptoms.    Denies fever, chills, weight loss, dysphagia, abdominal pain, nausea, vomiting, heartburn, reflux, constipation, diarrhea or rectal pain.  Previous GI evaluation: Assessment Summary for MBS Pt transferred to fluoro chair with supervision. Pt eats a Regular diet at baseline. Per EMR, pt's family reports complaints of coughing with PO. Unclear of onset or frequency as pt is not a reliable historian unfortunately. Pt denies dysphagia, odynophagia or pill dysphagia.  Pt presented with a grossly safe and functional oropharyngeal swallow. Mild deficits noted in lingual stripping wave, delayed swallow initiation, reduced epiglottic inversion, reduced base of tongue retraction and diminished pharyngeal stripping wave resulting in mild oropharyngeal dysphagia. No penetration or aspiration visualized. Esophageal screening notable for esophageal retention which improved but did not completely clear with alternating solids and liquids. Consider GI consult.  RECOMMENDATIONS MBS Recommendations: Routine Oral Care, Oral Intake Solid Recommendations: Regular Liquid Recommendations: Thin  Medication Recommendations: As-tolerated  Swallowing/Safe Feeding Strategies: Alternate solids and liquids, Remain upright 20-30 minutes following po intake, Upright as possible for all oral intake   Past Medical History:  Diagnosis Date   Alzheimer's dementia (HCC)    Anxiety  Arthritis    Blepharitis, both eyes    Diabetes mellitus without complication (HCC)    Diverticulosis    Esophagitis, reflux    Fatigue    Hepatic steatosis    Hypercholesteremia    Hypertension     Hypothyroid    Neuromuscular disorder (HCC)     Past Surgical History:  Procedure Laterality Date   CHOLECYSTECTOMY     45 years ago   COLONOSCOPY     SHOULDER SURGERY Right    TOTAL ABDOMINAL HYSTERECTOMY  2012    Current Outpatient Medications  Medication Sig Dispense Refill   amLODipine (NORVASC) 10 MG tablet Take 10 mg by mouth daily. (Patient not taking: Reported on 10/09/2023)     atorvastatin  (LIPITOR) 80 MG tablet Take 80 mg by mouth daily. (Patient not taking: Reported on 10/09/2023)     diclofenac Sodium (VOLTAREN) 1 % GEL Apply 2 g topically 4 (four) times daily. (Patient not taking: Reported on 10/09/2023)     esomeprazole (NEXIUM) 40 MG capsule Take 40 mg by mouth daily before breakfast. (Patient not taking: Reported on 10/09/2023)     gabapentin (NEURONTIN) 300 MG capsule Take 300 mg by mouth 2 (two) times daily. (Patient not taking: Reported on 10/09/2023)     GEMTESA 75 MG TABS Take 1 tablet by mouth daily. (Patient not taking: Reported on 10/09/2023)     latanoprost (XALATAN) 0.005 % ophthalmic solution Place 1 drop into both eyes at bedtime. (Patient not taking: Reported on 10/09/2023)     levothyroxine (SYNTHROID, LEVOTHROID) 50 MCG tablet  (Patient not taking: Reported on 10/09/2023)     losartan (COZAAR) 25 MG tablet Take 25 mg by mouth daily. (Patient not taking: Reported on 10/09/2023)     memantine  (NAMENDA ) 10 MG tablet Take 0.5 tablets (5 mg total) by mouth daily. (Patient not taking: Reported on 10/09/2023) 60 tablet 11   Potassium Chloride ER 20 MEQ TBCR Take 1 tablet by mouth daily. (Patient not taking: Reported on 10/09/2023)     sertraline  (ZOLOFT ) 100 MG tablet Take 1 tablet (100 mg total) by mouth daily. 90 tablet 3   sitaGLIPtin (JANUVIA) 50 MG tablet Take 50 mg by mouth daily. (Patient not taking: Reported on 10/09/2023)     No current facility-administered medications for this visit.    Allergies as of 02/13/2024 - Review Complete 10/09/2023  Allergen  Reaction Noted   Pravastatin Other (See Comments) 07/19/2015   Donepezil hcl Other (See Comments) 12/28/2022    Family History  Problem Relation Age of Onset   Colon cancer Neg Hx    Stomach cancer Neg Hx    Esophageal cancer Neg Hx    Rectal cancer Neg Hx    Liver cancer Neg Hx    Colon polyps Neg Hx     Social History   Socioeconomic History   Marital status: Married    Spouse name: Not on file   Number of children: 4   Years of education: Not on file   Highest education level: Not on file  Occupational History   Occupation: housewife  Tobacco Use   Smoking status: Never   Smokeless tobacco: Never  Vaping Use   Vaping status: Never Used  Substance and Sexual Activity   Alcohol use: No   Drug use: No   Sexual activity: Never  Other Topics Concern   Not on file  Social History Narrative   Not on file   Social Drivers of Health   Tobacco Use: Low Risk (  12/31/2023)   Received from [facility]   Patient History    Smoking Tobacco Use: Never    Smokeless Tobacco Use: Never    Passive Exposure: Never  Financial Resource Strain: Not on file  Food Insecurity: Low Risk (01/05/2024)   Received from [facility]   Epic    Within the past 12 months, you worried that your food would run out before you got money to buy more: Never true    Within the past 12 months, the food you bought just didn't last and you didn't have money to get more. : Never true  Transportation Needs: No Transportation Needs (01/05/2024)   Received from [facility]    In the past 12 months, has lack of reliable transportation kept you from medical appointments, meetings, work or from getting things needed for daily living? : No  Physical Activity: Not on file  Stress: Not on file  Social Connections: Unknown (07/05/2021)   Received from [facility]   Social Network    Social Network: Not on file  Intimate Partner Violence: Unknown (05/27/2021)   Received from [facility]   HITS    Physically Hurt: Not on file    Insult or Talk Down To: Not on file    Threaten Physical Harm: Not on file    Scream or Curse: Not on file  Depression (EYV7-0): Not on file  Alcohol Screen: Not on file  Housing: Low Risk (01/05/2024)   Received from [facility]   Epic    What is your living situation today?: I have a steady place to live    Think about the place you live. Do you have problems with any of the following? Choose all that apply:: None/None on this list  Utilities: Low Risk (01/05/2024)   Received from [facility]   Utilities    In the past 12 months has the electric, gas, oil, or water company threatened to shut off services in your home? : No  Health Literacy: Not on file    Review of Systems:    Constitutional: No weight loss, fever or chills Cardiovascular: No chest pain  Respiratory: No SOB  Gastrointestinal: See HPI and otherwise negative   Physical Exam:  Vital signs: BP 118/68 (BP Location: Right Arm, Patient Position: Sitting, Cuff Size: Normal)   Pulse 87   Ht 5' 4 (1.626 m)   Wt 161 lb 6 oz (73.2 kg)   BMI 27.70 kg/m    Constitutional:   Pleasant Hispanic female appears to be in NAD, Well developed, Well nourished, alert and cooperative Respiratory: Respirations even and unlabored. Lungs clear to auscultation bilaterally.   No wheezes, crackles, or rhonchi.  Cardiovascular: Normal S1, S2. No MRG. Regular rate and rhythm. No peripheral edema, cyanosis or pallor.  Gastrointestinal:  Soft, nondistended, nontender. No rebound or guarding. Normal bowel sounds. No appreciable masses or hepatomegaly. Rectal: External: No abnormality, question of posterior fissure but patient denies any pain; internal: No abnormality, no rectal prolapse Psychiatric: Demonstrates good judgement and reason without abnormal affect or behaviors.  See HPI for recent labs and imaging.  Assessment: 1.  Constipation: Recent abdominal x-ray 12/03/2023 with  constipation, but patient denies and send denies today, bowel sounds heard on exam 2.  History of hemorrhoids: Status post banding earlier this year, no further rectal bleeding per patient's son  Plan: 1.  Unfortunately, the patient's son brought her into clinic today and he does not live with her.  He is unaware of any GI complaints or concerns and she has Alzheimer's and is unable to relay her history.  In the computer it says she is here for rectal prolapse so I proceeded with a rectal exam.  There was no prolapse on my exam and hemorrhoids still look good after banding. 2.  Recent x-ray showed constipation but son is unaware of this, bowel sounds heard throughout the abdomen and no abdominal pain. 3.  Discussed with the patient's son that if he finds out she is having some sort of GI issues he can call back and let me know.  Otherwise continue current medications including Nexium 40 mg daily.  If constipation was a concern would add MiraLAX  daily. 4.  Patient to follow in clinic with us  as needed.  Delon Failing, PA-C [facility] 02/11/2024, 11:26 AM  Cc: Delilah Murray HERO., MD  "

## 2024-02-13 ENCOUNTER — Encounter: Payer: Self-pay | Admitting: Physician Assistant

## 2024-02-13 ENCOUNTER — Ambulatory Visit: Admitting: Physician Assistant

## 2024-02-13 VITALS — BP 118/68 | HR 87 | Ht 64.0 in | Wt 161.4 lb

## 2024-02-13 DIAGNOSIS — K5900 Constipation, unspecified: Secondary | ICD-10-CM

## 2024-02-13 DIAGNOSIS — K649 Unspecified hemorrhoids: Secondary | ICD-10-CM

## 2024-02-13 DIAGNOSIS — Z8719 Personal history of other diseases of the digestive system: Secondary | ICD-10-CM

## 2024-02-13 NOTE — Progress Notes (Signed)
 ____________________________________________________________  Attending physician addendum:  Thank you for sending this case to me. I have reviewed the entire note and agree with the plan.  Thanks for reexamining her as well  Victory Brand, MD  ____________________________________________________________

## 2024-02-13 NOTE — Patient Instructions (Addendum)
 Follow up as needed.   _______________________________________________________  If your blood pressure at your visit was 140/90 or greater, please contact your primary care physician to follow up on this.  _______________________________________________________  If you are age 85 or older, your body mass index should be between 23-30. Your Body mass index is 27.7 kg/m. If this is out of the aforementioned range listed, please consider follow up with your Primary Care Provider.  If you are age 75 or younger, your body mass index should be between 19-25. Your Body mass index is 27.7 kg/m. If this is out of the aformentioned range listed, please consider follow up with your Primary Care Provider.   ________________________________________________________  The [facility] providers would like to encourage you to use MYCHART to communicate with providers for non-urgent requests or questions.  Due to long hold times on the telephone, sending your provider a message by [facility] may be a faster and more efficient way to get a response.  Please allow 48 business hours for a response.  Please remember that this is for non-urgent requests.  _______________________________________________________  [facility] is using a team-based approach to care.  Your team is made up of your doctor and two to three APPS. Our APPS (Nurse Practitioners and Physician Assistants) work with your physician to ensure care continuity for you. They are fully qualified to address your health concerns and develop a treatment plan. They communicate directly with your gastroenterologist to care for you. Seeing the Advanced Practice Practitioners on your physician's team can help you by facilitating care more promptly, often allowing for earlier appointments, access to diagnostic testing, procedures, and other specialty referrals.

## 2024-02-15 ENCOUNTER — Telehealth: Payer: Self-pay | Admitting: Physician Assistant

## 2024-02-15 NOTE — Telephone Encounter (Signed)
 Telephone call  02/15/2024 3:40 PM  Called the patient's granddaughter, recall when this patient was in clinic she was with her grandson who is not really with her much.  Per granddaughter her mom is the one who cleans up her grandmother and has noticed a ball protruding from her rectum sometimes when she has loose stool, which is only very occasionally, the ball is painful and tender to the touch and then tends to recede on its own.  Not sure what size this is.  Not sure if it was before or after the hemorrhoid banding.  Discussed with the granddaughter that it would be very helpful to get a picture of this when it happens.  I was unable to reproduce a rectal prolapse in clinic and there were no hemorrhoids on anoscopy.  I am not exactly sure what they are seeing though it does sound like a prolapse.  Explained that a rectal prolapse would be a surgical fix.  Would like to get a picture of this to make sure that is what it is before we refer her to surgery.  Also recommended fiber suplement such as Metamucil, Citrucel or Benefiber.  Discussed once daily dosing of this to help with variance in stools and possible rectal prolapse. Avoid constipation.  Granddaughter will call back when they get a picture so that we have more information going forward.  Delon Failing, PA-C

## 2024-02-15 NOTE — Telephone Encounter (Signed)
 Inbound call from patient granddaughter requesting to explain the patient recent office visit. Patient is having issue was loose bowels and something protruding from her rectum the size of a ball whenever she is in a siting position. Patient complains that it is very painful and tender to touch. Patient grandchild would like a call back at [phone]. Please advise.

## 2024-02-15 NOTE — Telephone Encounter (Signed)
 Would you mind reaching out to patient's granddaughter since you were the one that assessed her and there is language/cognitive barriers.

## 2024-03-19 ENCOUNTER — Encounter: Payer: Self-pay | Admitting: Neurology

## 2024-03-19 ENCOUNTER — Other Ambulatory Visit: Payer: Self-pay | Admitting: Neurology

## 2024-03-19 MED ORDER — MEMANTINE HCL 10 MG PO TABS: 10.0000 mg | ORAL_TABLET | Freq: Every day | ORAL | 11 refills | Status: AC

## 2024-09-08 ENCOUNTER — Ambulatory Visit: Admitting: Neurology
